# Patient Record
Sex: Female | Born: 1990 | Race: White | Hispanic: No | Marital: Single | Smoking: Never smoker
Health system: Southern US, Community
[De-identification: ages and names within clinical notes are randomized; demographics above are authoritative.]

## PROBLEM LIST (undated history)

## (undated) DIAGNOSIS — K219 Gastro-esophageal reflux disease without esophagitis: Secondary | ICD-10-CM

## (undated) DIAGNOSIS — Z8719 Personal history of other diseases of the digestive system: Secondary | ICD-10-CM

## (undated) DIAGNOSIS — K589 Irritable bowel syndrome without diarrhea: Secondary | ICD-10-CM

## (undated) DIAGNOSIS — F909 Attention-deficit hyperactivity disorder, unspecified type: Secondary | ICD-10-CM

## (undated) HISTORY — DX: Attention-deficit hyperactivity disorder, unspecified type: F90.9

## (undated) HISTORY — DX: Irritable bowel syndrome, unspecified: K58.9

---
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
## 2012-02-26 ENCOUNTER — Emergency Department (HOSPITAL_COMMUNITY)
Admission: EM | Admit: 2012-02-26 | Discharge: 2012-02-26 | Disposition: A | Discharge status: Home / Self Care | Payer: Medicaid Other | Attending: Emergency Medicine | Admitting: Emergency Medicine

## 2012-02-26 ENCOUNTER — Encounter (HOSPITAL_COMMUNITY): Payer: Self-pay | Admitting: Emergency Medicine

## 2012-02-26 DIAGNOSIS — R1013 Epigastric pain: Secondary | ICD-10-CM

## 2012-02-26 DIAGNOSIS — N939 Abnormal uterine and vaginal bleeding, unspecified: Secondary | ICD-10-CM

## 2012-02-26 DIAGNOSIS — N898 Other specified noninflammatory disorders of vagina: Secondary | ICD-10-CM | POA: Insufficient documentation

## 2012-02-26 DIAGNOSIS — K3189 Other diseases of stomach and duodenum: Secondary | ICD-10-CM | POA: Insufficient documentation

## 2012-02-26 DIAGNOSIS — Z3202 Encounter for pregnancy test, result negative: Secondary | ICD-10-CM | POA: Insufficient documentation

## 2012-02-26 LAB — COMPREHENSIVE METABOLIC PANEL
Albumin: 3.9 g/dL (ref 3.5–5.2)
Alkaline Phosphatase: 41 U/L (ref 39–117)
BUN: 9 mg/dL (ref 6–23)
Chloride: 105 mEq/L (ref 96–112)
GFR calc Af Amer: >90 mL/min (ref >90)
Glucose, Bld: 94 mg/dL (ref 70–99)
Potassium: 3.8 mEq/L (ref 3.5–5.1)
Total Bilirubin: 0.2 mg/dL — ABNORMAL LOW (ref 0.3–1.2)

## 2012-02-26 LAB — CBC WITH DIFFERENTIAL/PLATELET
Hemoglobin: 12 g/dL (ref 12.0–15.0)
Lymphocytes Relative: 30 % (ref 12–46)
Lymphs Abs: 1.6 10*3/uL (ref 0.7–4.0)
Monocytes Relative: 8 % (ref 3–12)
Neutro Abs: 3 10*3/uL (ref 1.7–7.7)
Neutrophils Relative %: 57 % (ref 43–77)
RBC: 4.12 MIL/uL (ref 3.87–5.11)

## 2012-02-26 LAB — URINALYSIS, ROUTINE W REFLEX MICROSCOPIC
Nitrite: NEGATIVE
Specific Gravity, Urine: 1.017 (ref 1.005–1.030)
pH: 5.5 (ref 5.0–8.0)

## 2012-02-26 LAB — URINE MICROSCOPIC-ADD ON

## 2012-02-26 LAB — PREGNANCY, URINE: Preg Test, Ur: NEGATIVE

## 2012-02-26 MED ORDER — OMEPRAZOLE 20 MG PO CPDR: 40.0000 mg | DELAYED_RELEASE_CAPSULE | Freq: Every day | ORAL | Status: DC

## 2012-02-26 MED ORDER — SULFAMETHOXAZOLE-TRIMETHOPRIM 800-160 MG PO TABS: 1.0000 | ORAL_TABLET | Freq: Two times a day (BID) | ORAL | Status: DC

## 2012-02-26 NOTE — ED Notes (Signed)
Pt c/o abd pain x 2 months, dizzy,nausea. Pt sts she had a large blood clot today.VSS.

## 2012-02-26 NOTE — ED Notes (Signed)
Pt from home, alert and oriented, with c/o abdominal pain x 1 month. sts she also passed a large blood clot earlier today- no bleeding before or after clot passed. Sts she has been on birth control x 2 years and has not had any bleeding since beginning. Sts she has also had nausea before meals and stomach upset after meals x 1 month. No nausea or abdominal pain at this time.

## 2012-02-26 NOTE — ED Provider Notes (Signed)
History    CSN: [id number] Arrival date & time 02/26/12  Mikle Bosworth First MD Initiated Contact with Patient 02/26/12 2039     Chief Complaint  Patient presents with  . Vaginal Bleeding   HPI Pt has been having trouble with her stomach for about 2 months.  She notices that if she eats something she will get cramping.  She also has trouble if she doesn't eat for a while.  She feels like it is in knots.  This has been ongoing.  Today she had some blood from her vagina (small blood clot) and when she mentioned this to her sister she thought she should come in to make sure she was not pregnant.  She denies any lower abdominal pain.  No persistent bleeding  No fever.  Last vomited one week ago.  No diarrhea.  History reviewed. No pertinent past medical history.  History reviewed. No pertinent past surgical history.  No family history on file.  History  Substance Use Topics  . Smoking status: Never Smoker   . Smokeless tobacco: Not on file  . Alcohol Use: No    OB History    Grav Para Term Preterm Abortions TAB SAB Ect Mult Living                  Review of Systems  All other systems reviewed and are negative.    Allergies  Review of patient's allergies indicates no known allergies.  Home Medications   Current Outpatient Rx  Name  Route  Sig  Dispense  Refill  . ACETAMINOPHEN 500 MG PO TABS   Oral   Take 1,000 mg by mouth every 6 (six) hours as needed. pain         . ETONOGESTREL 68 MG [place] IMPL   Subcutaneous   Inject 1 each into the skin once.             BP 126/71  Pulse 87  Temp 97.9 F (36.6 C) (Oral)  Resp 12  SpO2 97%  LMP 02/26/2012  Physical Exam  Nursing note and vitals reviewed. Constitutional: She appears well-developed and well-nourished. No distress.  HENT:  Head: Normocephalic and atraumatic.  Right Ear: External ear normal.  Left Ear: External ear normal.  Eyes: Conjunctivae normal are normal. Right eye exhibits no discharge. Left eye  exhibits no discharge. No scleral icterus.  Neck: Neck supple. No tracheal deviation present.  Cardiovascular: Normal rate, regular rhythm and intact distal pulses.   Pulmonary/Chest: Effort normal and breath sounds normal. No stridor. No respiratory distress. She has no wheezes. She has no rales.  Abdominal: Soft. Bowel sounds are normal. She exhibits no distension. There is no tenderness. There is no rebound and no guarding.  Musculoskeletal: She exhibits no edema and no tenderness.  Neurological: She is alert. She has normal strength. No sensory deficit. Cranial nerve deficit:  no gross defecits noted. She exhibits normal muscle tone. She displays no seizure activity. Coordination normal.  Skin: Skin is warm and dry. No rash noted.  Psychiatric: She has a normal mood and affect.    ED Course  Procedures (including critical care time)  Labs Reviewed  URINALYSIS, ROUTINE W REFLEX MICROSCOPIC - Abnormal; Notable for the following:    APPearance CLOUDY (*)     Hgb urine dipstick MODERATE (*)     Leukocytes, UA MODERATE (*)     All other components within normal limits  CBC WITH DIFFERENTIAL - Abnormal; Notable for the following:  HCT 34.5 (*)     All other components within normal limits  COMPREHENSIVE METABOLIC PANEL - Abnormal; Notable for the following:    Total Bilirubin 0.2 (*)     All other components within normal limits  URINE MICROSCOPIC-ADD ON - Abnormal; Notable for the following:    Squamous Epithelial / LPF FEW (*)     Bacteria, UA FEW (*)     All other components within normal limits  PREGNANCY, URINE  URINE CULTURE   No results found.   1. Vaginal bleeding   2. Dyspepsia       MDM  Pt is not pregnant. SHe has no lower abdominal complaints.  Pt defers pelvic exam.  Will try course of antacids for her symptoms that have been ongoing for the last two months.  Recc outpt follow up with PCP.  At this time there does not appear to be any evidence of an acute  emergency medical condition and the patient appears stable for discharge with appropriate outpatient follow up.        Celene Kras, MD 02/26/12 2110

## 2012-02-27 LAB — URINE CULTURE: Culture: NO GROWTH

## 2012-03-14 ENCOUNTER — Other Ambulatory Visit: Payer: Self-pay | Admitting: Family Medicine

## 2012-03-14 DIAGNOSIS — R1011 Right upper quadrant pain: Secondary | ICD-10-CM

## 2012-03-20 ENCOUNTER — Ambulatory Visit (HOSPITAL_COMMUNITY): Payer: Self-pay

## 2012-08-19 ENCOUNTER — Encounter: Payer: Self-pay | Admitting: *Deleted

## 2012-08-20 ENCOUNTER — Ambulatory Visit: Payer: Self-pay | Admitting: Adult Health

## 2012-10-03 ENCOUNTER — Emergency Department (HOSPITAL_COMMUNITY): Payer: BC Managed Care – PPO

## 2012-10-03 ENCOUNTER — Emergency Department (HOSPITAL_COMMUNITY)
Admission: EM | Admit: 2012-10-03 | Discharge: 2012-10-03 | Disposition: A | Discharge status: Home / Self Care | Payer: BC Managed Care – PPO | Attending: Emergency Medicine | Admitting: Emergency Medicine

## 2012-10-03 ENCOUNTER — Encounter (HOSPITAL_COMMUNITY): Payer: Self-pay | Admitting: *Deleted

## 2012-10-03 DIAGNOSIS — R109 Unspecified abdominal pain: Secondary | ICD-10-CM

## 2012-10-03 DIAGNOSIS — Z3202 Encounter for pregnancy test, result negative: Secondary | ICD-10-CM | POA: Insufficient documentation

## 2012-10-03 DIAGNOSIS — R1013 Epigastric pain: Secondary | ICD-10-CM | POA: Insufficient documentation

## 2012-10-03 LAB — CBC WITH DIFFERENTIAL/PLATELET
Basophils Absolute: 0 10*3/uL (ref 0.0–0.1)
Basophils Relative: 1 % (ref 0–1)
Hemoglobin: 12.9 g/dL (ref 12.0–15.0)
MCHC: 34.7 g/dL (ref 30.0–36.0)
Monocytes Relative: 6 % (ref 3–12)
Neutro Abs: 3.1 10*3/uL (ref 1.7–7.7)
Neutrophils Relative %: 64 % (ref 43–77)

## 2012-10-03 LAB — COMPREHENSIVE METABOLIC PANEL
ALT: 12 U/L (ref 0–35)
AST: 18 U/L (ref 0–37)
Albumin: 4.2 g/dL (ref 3.5–5.2)
Alkaline Phosphatase: 38 U/L — ABNORMAL LOW (ref 39–117)
BUN: 12 mg/dL (ref 6–23)
Chloride: 105 mEq/L (ref 96–112)
Potassium: 3.9 mEq/L (ref 3.5–5.1)
Sodium: 140 mEq/L (ref 135–145)
Total Bilirubin: 0.2 mg/dL — ABNORMAL LOW (ref 0.3–1.2)

## 2012-10-03 LAB — URINE MICROSCOPIC-ADD ON

## 2012-10-03 LAB — URINALYSIS, ROUTINE W REFLEX MICROSCOPIC
Bilirubin Urine: NEGATIVE
Glucose, UA: NEGATIVE mg/dL
Ketones, ur: NEGATIVE mg/dL
pH: 7.5 (ref 5.0–8.0)

## 2012-10-03 MED ORDER — SUCRALFATE 1 GM/10ML PO SUSP: 1.0000 g | Freq: Four times a day (QID) | ORAL | Status: DC

## 2012-10-03 MED ORDER — PANTOPRAZOLE SODIUM 20 MG PO TBEC: 20.0000 mg | DELAYED_RELEASE_TABLET | Freq: Every day | ORAL | Status: DC

## 2012-10-03 NOTE — ED Provider Notes (Signed)
History    CSN: [id number] Arrival date & time 10/03/12  1630  First MD Initiated Contact with Patient 10/03/12 1905     Chief Complaint  Patient presents with  . Abdominal Pain   (Consider location/radiation/quality/duration/timing/severity/associated sxs/prior Treatment) HPI Comments: Patient presents to ER for evaluation of abdominal pain. Patient has been having pain in the center of her upper abdomen radiating up into the chest for several months. She saw her doctor and was told it might be an ulcer, was scheduled for an ultrasound. Patient did not make the ultrasound and is now experiencing increased discomfort. Patient reports a burning, crampy pain in the upper abdomen that occurs intermittently. She has not had vomiting. There is no rectal bleeding or dark stools.  Patient is a 22 y.o. female presenting with abdominal pain.  Abdominal Pain Associated symptoms include abdominal pain.   Past Medical History  Diagnosis Date  . Medical history non-contributory    Past Surgical History  Procedure Laterality Date  . Right arm  2007    aztec nm   Family History  Problem Relation Age of Onset  . Hypertension Other   . Cancer Other    History  Substance Use Topics  . Smoking status: Never Smoker   . Smokeless tobacco: Not on file  . Alcohol Use: No   OB History   Grav Para Term Preterm Abortions TAB SAB Ect Mult Living                 Review of Systems  Constitutional: Negative for fever.  Gastrointestinal: Positive for abdominal pain. Negative for blood in stool.  All other systems reviewed and are negative.    Allergies  Review of patient's allergies indicates no known allergies.  Home Medications   Current Outpatient Rx  Name  Route  Sig  Dispense  Refill  . etonogestrel (IMPLANON) 68 MG IMPL implant   Subcutaneous   Inject 1 each into the skin once.            BP 112/71  Pulse 69  Temp(Src) 98.8 F (37.1 C) (Oral)  Resp 16  SpO2  100% Physical Exam  Constitutional: She is oriented to person, place, and time. She appears well-developed and well-nourished. No distress.  HENT:  Head: Normocephalic and atraumatic.  Right Ear: Hearing normal.  Left Ear: Hearing normal.  Nose: Nose normal.  Mouth/Throat: Oropharynx is clear and moist and mucous membranes are normal.  Eyes: Conjunctivae and EOM are normal. Pupils are equal, round, and reactive to light.  Neck: Normal range of motion. Neck supple.  Cardiovascular: Regular rhythm, S1 normal and S2 normal.  Exam reveals no gallop and no friction rub.   No murmur heard. Pulmonary/Chest: Effort normal and breath sounds normal. No respiratory distress. She exhibits no tenderness.  Abdominal: Soft. Normal appearance and bowel sounds are normal. There is no hepatosplenomegaly. There is tenderness in the epigastric area. There is no rebound, no guarding, no tenderness at McBurney's point and negative Murphy's sign. No hernia.  Musculoskeletal: Normal range of motion.  Neurological: She is alert and oriented to person, place, and time. She has normal strength. No cranial nerve deficit or sensory deficit. Coordination normal. GCS eye subscore is 4. GCS verbal subscore is 5. GCS motor subscore is 6.  Skin: Skin is warm, dry and intact. No rash noted. No cyanosis.  Psychiatric: She has a normal mood and affect. Her speech is normal and behavior is normal. Thought content normal.  ED Course  Procedures (including critical care time) Labs Reviewed  COMPREHENSIVE METABOLIC PANEL - Abnormal; Notable for the following:    Alkaline Phosphatase 38 (*)    Total Bilirubin 0.2 (*)    All other components within normal limits  URINALYSIS, ROUTINE W REFLEX MICROSCOPIC - Abnormal; Notable for the following:    APPearance CLOUDY (*)    Hgb urine dipstick SMALL (*)    Nitrite POSITIVE (*)    Leukocytes, UA SMALL (*)    All other components within normal limits  URINE MICROSCOPIC-ADD ON -  Abnormal; Notable for the following:    Squamous Epithelial / LPF FEW (*)    Bacteria, UA MANY (*)    All other components within normal limits  URINE CULTURE  CBC WITH DIFFERENTIAL  LIPASE, BLOOD  POCT PREGNANCY, URINE   US Abdomen Complete  10/03/2012   *RADIOLOGY REPORT*  Clinical Data:  Abdominal pain  COMPLETE ABDOMINAL ULTRASOUND  Comparison:  None.  Findings:  Gallbladder:  Contracted gallbladder with limited evaluation of the gallbladder.  Gallbladder wall is thickened due to incomplete distention.  The patient claims n.p.o. for 7 hours.  The patient is not tender over the gallbladder.  No gallstones are identified.  Common bile duct:  2.5 mm  Liver:  No focal lesion identified.  Within normal limits in parenchymal echogenicity.  IVC:  Appears normal.  Pancreas:  No focal abnormality seen.  Spleen:  9.4 cm  Right Kidney:  12.0 cm.  Normal  Left Kidney:  10.5 cm.  Normal  Abdominal aorta:  No aneurysm identified.  IMPRESSION: Contracted gallbladder limiting evaluation of the gallbladder. Gallbladder wall is thickened, consistent with incomplete distention.  Follow-up ultrasound may be helpful to evaluate the gallbladder.   Original Report Authenticated By: Janeece Riggers, M.D.   Diagnosis: Abdominal pain  MDM  Patient comes to the ER for evaluation of abdominal pain. Patient has been having epigastric abdominal discomfort for several months. She reports that she was supposed to have an ultrasound performed by her doctor, but missed the evaluation. She does have worsening symptoms and wants to be evaluated. She did not have right upper quadrant tenderness, but was insistent that she required an ultrasound. Ultrasound shows no evidence of gallbladder disease. Blood work was unremarkable. She does have a urinary tract infection. Patient will be treated for urinary tract infection and is referred to GI as she likely has GERD, but cannot rule out peptic ulcer. She is not anemic and has not had any GI  bleeding.  Gilda Crease, MD 10/03/12 2040

## 2012-10-03 NOTE — ED Notes (Signed)
Pt reports having lower and upper abd pains for extended amount of time, pt has seen pcp and was told it was possible ulcer but pt did not go for further testing. Still having the abd pain and nausea. No acute distress noted at triage.

## 2012-10-05 LAB — URINE CULTURE

## 2012-10-06 ENCOUNTER — Telehealth (HOSPITAL_COMMUNITY): Payer: Self-pay | Admitting: Emergency Medicine

## 2012-10-06 NOTE — ED Notes (Signed)
Post ED Visit - Positive Culture Follow-up: Successful Patient Follow-Up  Culture assessed and recommendations reviewed by: []  Wes Dulaney, Pharm.D., BCPS []  Celedonio Miyamoto, [address].D., BCPS []  Georgina Pillion, [address].D., BCPS []  [facility], [address].D., BCPS, AAHIVP []  Estella Husk, Pharm.D., BCPS, AAHIVP [x]  Okey Regal, Pharm.D., BCPS  Positive urine culture  [x]  Patient discharged without antimicrobial prescription and treatment is now indicated []  Organism is resistant to prescribed ED discharge antimicrobial []  Patient with positive blood cultures  Changes discussed with ED provider: Marlon Pel PA-c New antibiotic prescription: Kelfex 500 mg BID x 7 days    Kylie A Holland 10/06/2012, 3:21 PM

## 2012-10-06 NOTE — Progress Notes (Signed)
ED Antimicrobial Stewardship Positive Culture Follow Up   Deloria Brassfield is an 22 y.o. female who presented to [facility] on 10/03/2012 with a chief complaint of  Chief Complaint  Patient presents with  . Abdominal Pain    Recent Results (from the past 720 hour(s))  URINE CULTURE     Status: None   Collection Time    10/03/12  4:55 PM      Result Value Range Status   Specimen Description URINE, RANDOM   Final   Special Requests NONE   Final   Culture  Setup Time 10/03/2012 22:53   Final   Colony Count >=100,000 COLONIES/ML   Final   Culture ESCHERICHIA COLI   Final   Report Status 10/05/2012 FINAL   Final   Organism ID, Bacteria ESCHERICHIA COLI   Final    []  Treated with , organism resistant to prescribed antimicrobial [x]  Patient discharged originally without antimicrobial agent and treatment is now indicated  New antibiotic prescription: Keflex 500 mg po BID x 7 days ED Provider: Luberta Robertson 10/06/2012, 2:40 PM Infectious Diseases Pharmacist Phone# [phone]

## 2015-04-20 ENCOUNTER — Emergency Department (HOSPITAL_COMMUNITY): Payer: Medicaid Other

## 2015-04-20 ENCOUNTER — Encounter (HOSPITAL_COMMUNITY): Payer: Self-pay | Admitting: Emergency Medicine

## 2015-04-20 ENCOUNTER — Emergency Department (HOSPITAL_COMMUNITY)
Admission: EM | Admit: 2015-04-20 | Discharge: 2015-04-20 | Disposition: A | Discharge status: Home / Self Care | Payer: Medicaid Other | Attending: Emergency Medicine | Admitting: Emergency Medicine

## 2015-04-20 DIAGNOSIS — M79672 Pain in left foot: Secondary | ICD-10-CM | POA: Insufficient documentation

## 2015-04-20 DIAGNOSIS — M7989 Other specified soft tissue disorders: Secondary | ICD-10-CM | POA: Insufficient documentation

## 2015-04-20 MED ORDER — IBUPROFEN 600 MG PO TABS: 600.0000 mg | ORAL_TABLET | Freq: Three times a day (TID) | ORAL | Status: DC

## 2015-04-20 MED ORDER — TRAMADOL HCL 50 MG PO TABS: 50.0000 mg | ORAL_TABLET | Freq: Four times a day (QID) | ORAL | Status: DC | PRN

## 2015-04-20 MED ORDER — TRAMADOL HCL 50 MG PO TABS
50.0000 mg | ORAL_TABLET | Freq: Once | ORAL | Status: AC
  Administered 2015-04-20: 50 mg via ORAL
  Filled 2015-04-20: qty 1

## 2015-04-20 NOTE — ED Notes (Signed)
Pt states that she has been having pain in left great toe and ball of foot for several months.  Unable to bear weight without pain. Denies injury.

## 2015-04-20 NOTE — ED Provider Notes (Signed)
CSN: [id number]     Arrival date & time 04/20/15  1807 History   First MD Initiated Contact with Patient 04/20/15 1825     Chief Complaint  Patient presents with  . Foot Pain     (Consider location/radiation/quality/duration/timing/severity/associated sxs/prior Treatment) The history is provided by the patient.   Pamela Potts is a 25 y.o. female  Presenting with pain and swelling of her left plantar foot at the 1st metarsal head.  She denies injury, reports similar episode of pain months ago despite again no known injury which lasted about a week then resolved.  Her pain is present with weight bearing and palpation, better at rest.  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  Her pain is sharp and does not radiate.  She has found no alleviators.  Denies fevers, rash, skin trauma.     Past Medical History  Diagnosis Date  . Medical history non-contributory    Past Surgical History  Procedure Laterality Date  . Right arm  2007    aztec nm   Family History  Problem Relation Age of Onset  . Hypertension Other   . Cancer Other    Social History  Substance Use Topics  . Smoking status: Never Smoker   . Smokeless tobacco: None  . Alcohol Use: No   OB History    No data available     Review of Systems  Constitutional: Negative for fever.  Musculoskeletal: Positive for joint swelling and arthralgias. Negative for myalgias.  Neurological: Negative for weakness and numbness.      Allergies  Review of patient's allergies indicates no known allergies.  Home Medications   Prior to Admission medications   Medication Sig Start Date End Date Taking? Authorizing Provider  etonogestrel (IMPLANON) 68 MG IMPL implant Inject 1 each into the skin once.      Historical Provider, MD  traMADol (ULTRAM) 50 MG tablet Take 1 tablet (50 mg total) by mouth every 6 (six) hours as needed. 04/20/15   Burgess Amor, PA-C   BP 114/96 mmHg  Pulse 92  Temp(Src) 98.6 F (37  C) (Oral)  Resp 16  Ht 5\' 5"  (1.651 m)  Wt 58.514 kg  BMI 21.47 kg/m2  SpO2 100%  LMP 04/13/2015 Physical Exam  Constitutional: She appears well-developed and well-nourished.  HENT:  Head: Atraumatic.  Neck: Normal range of motion.  Cardiovascular:  Pulses:      Dorsalis pedis pulses are 2+ on the right side, and 2+ on the left side.  Pulses equal bilaterally  Musculoskeletal: She exhibits tenderness.       Left foot: There is tenderness and swelling. There is normal capillary refill, no crepitus and no deformity.  ttp left metarsal head of left foot. Edema appreciated at this site in comparison to the right, no erythema.  Skin intact. She has pronounced high instep bilaterally.  Neurological: She is alert. She has normal strength. She displays normal reflexes. No sensory deficit.  Skin: Skin is warm and dry. No ecchymosis and no rash noted. No erythema.  Psychiatric: She has a normal mood and affect.    ED Course  Procedures (including critical care time) Labs Review Labs Reviewed - No data to display  Imaging Review Dg Foot Complete Left  04/20/2015  CLINICAL DATA:  Pain in the first MTP joint of the left foot since this morning without injury. EXAM: LEFT FOOT - COMPLETE 3+ VIEW COMPARISON:  None. FINDINGS: There is no evidence of  fracture or dislocation. There is no evidence of arthropathy. No acute soft tissue finding. Incidental bipartite appearance of the great toe medial sesamoid. The lucency within the cortex of the distal fibula has a benign appearance favoring nutrient channel. IMPRESSION: Negative. Electronically Signed   By: Marnee SpringJonathon  Watts M.D.   On: 04/20/2015 19:06   I have personally reviewed and evaluated these images and lab results as part of my medical decision-making.   EKG Interpretation None      MDM   Final diagnoses:  Foot pain, left    Pt is point tender at the site of the sesamoids, question significance of the bipartite appearance of this  bone.  No apparent fx/dislocation.  With her high instep, normal ambulation will put extra strain and pressure at this site.  She was encouraged ice, elevation, crutches provided for prn use.  She was prescribed ibuprofen and tramadol for pain and inflammation.  Referred to ortho for further eval/management.  The patient appears reasonably screened and/or stabilized for discharge and I doubt any other medical condition or other [facility] requiring further screening, evaluation, or treatment in the ED at this time prior to discharge.     Burgess AmorJulie Annmargaret Decaprio, PA-C 04/21/15 1355  Samuel JesterKathleen McManus, DO 04/24/15 [phone]

## 2015-04-20 NOTE — Discharge Instructions (Signed)

## 2015-04-20 NOTE — ED Notes (Signed)
Pt alert & oriented x4, stable gait. Patient given discharge instructions, paperwork & prescription(s). Patient  instructed to stop at the registration desk to finish any additional paperwork. Patient verbalized understanding. Pt left department w/ no further questions. 

## 2016-04-20 ENCOUNTER — Encounter: Payer: Self-pay | Admitting: Family Medicine

## 2016-04-20 ENCOUNTER — Ambulatory Visit (INDEPENDENT_AMBULATORY_CARE_PROVIDER_SITE_OTHER): Payer: 59 | Admitting: Family Medicine

## 2016-04-20 VITALS — BP 112/74 | HR 85 | Temp 97.7°F | Ht 65.0 in | Wt 131.5 lb

## 2016-04-20 DIAGNOSIS — K219 Gastro-esophageal reflux disease without esophagitis: Secondary | ICD-10-CM

## 2016-04-20 DIAGNOSIS — N926 Irregular menstruation, unspecified: Secondary | ICD-10-CM | POA: Diagnosis not present

## 2016-04-20 DIAGNOSIS — F902 Attention-deficit hyperactivity disorder, combined type: Secondary | ICD-10-CM | POA: Diagnosis not present

## 2016-04-20 DIAGNOSIS — F411 Generalized anxiety disorder: Secondary | ICD-10-CM

## 2016-04-20 LAB — PREGNANCY, URINE: PREG TEST UR: NEGATIVE

## 2016-04-20 MED ORDER — ESCITALOPRAM OXALATE 10 MG PO TABS: 10.0000 mg | ORAL_TABLET | Freq: Every day | ORAL | 1 refills | Status: DC

## 2016-04-20 MED ORDER — LISDEXAMFETAMINE DIMESYLATE 30 MG PO CAPS: 30.0000 mg | ORAL_CAPSULE | Freq: Every day | ORAL | 0 refills | Status: DC

## 2016-04-20 MED ORDER — RANITIDINE HCL 150 MG PO TABS: 150.0000 mg | ORAL_TABLET | Freq: Two times a day (BID) | ORAL | 2 refills | Status: DC
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
## 2016-04-26 LAB — TOXASSURE SELECT 13 (MW), URINE

## 2016-05-25 ENCOUNTER — Ambulatory Visit: Payer: 59 | Admitting: Family Medicine

## 2016-05-26 ENCOUNTER — Encounter: Payer: Self-pay | Admitting: Family Medicine

## 2016-05-26 ENCOUNTER — Ambulatory Visit (INDEPENDENT_AMBULATORY_CARE_PROVIDER_SITE_OTHER): Payer: 59 | Admitting: Family Medicine

## 2016-05-26 VITALS — BP 102/64 | HR 74 | Temp 97.6°F | Ht 65.0 in | Wt 131.0 lb

## 2016-05-26 DIAGNOSIS — F902 Attention-deficit hyperactivity disorder, combined type: Secondary | ICD-10-CM | POA: Diagnosis not present

## 2016-05-26 DIAGNOSIS — K219 Gastro-esophageal reflux disease without esophagitis: Secondary | ICD-10-CM

## 2016-05-26 DIAGNOSIS — F411 Generalized anxiety disorder: Secondary | ICD-10-CM

## 2016-05-26 MED ORDER — OMEPRAZOLE 20 MG PO CPDR: 20.0000 mg | DELAYED_RELEASE_CAPSULE | Freq: Every day | ORAL | 3 refills | Status: DC

## 2016-05-26 MED ORDER — ESCITALOPRAM OXALATE 20 MG PO TABS: 20.0000 mg | ORAL_TABLET | Freq: Every day | ORAL | 1 refills | Status: DC

## 2016-05-26 MED ORDER — LISDEXAMFETAMINE DIMESYLATE 40 MG PO CAPS: 40.0000 mg | ORAL_CAPSULE | Freq: Every day | ORAL | 0 refills | Status: DC
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
## 2016-06-04 ENCOUNTER — Encounter: Payer: Self-pay | Admitting: Family Medicine

## 2016-06-05 ENCOUNTER — Other Ambulatory Visit: Payer: Self-pay | Admitting: Family Medicine

## 2016-06-05 DIAGNOSIS — F902 Attention-deficit hyperactivity disorder, combined type: Secondary | ICD-10-CM

## 2016-06-05 DIAGNOSIS — K219 Gastro-esophageal reflux disease without esophagitis: Secondary | ICD-10-CM

## 2016-06-05 DIAGNOSIS — F411 Generalized anxiety disorder: Secondary | ICD-10-CM

## 2016-06-05 MED ORDER — LISDEXAMFETAMINE DIMESYLATE 40 MG PO CAPS: 40.0000 mg | ORAL_CAPSULE | Freq: Every day | ORAL | 0 refills | Status: DC

## 2016-06-05 MED ORDER — OMEPRAZOLE 20 MG PO CPDR: 20.0000 mg | DELAYED_RELEASE_CAPSULE | Freq: Every day | ORAL | 3 refills | Status: DC

## 2016-06-05 MED ORDER — ESCITALOPRAM OXALATE 20 MG PO TABS: 20.0000 mg | ORAL_TABLET | Freq: Every day | ORAL | 1 refills | Status: DC

## 2016-06-05 NOTE — Progress Notes (Unsigned)
Prescription at front desk, patient aware

## 2016-06-23 ENCOUNTER — Encounter: Payer: Self-pay | Admitting: Family Medicine

## 2016-06-23 ENCOUNTER — Ambulatory Visit (INDEPENDENT_AMBULATORY_CARE_PROVIDER_SITE_OTHER): Payer: Self-pay | Admitting: Family Medicine

## 2016-06-23 VITALS — BP 105/64 | HR 68 | Temp 97.0°F | Ht 65.0 in | Wt 134.8 lb

## 2016-06-23 DIAGNOSIS — F411 Generalized anxiety disorder: Secondary | ICD-10-CM

## 2016-06-23 DIAGNOSIS — F902 Attention-deficit hyperactivity disorder, combined type: Secondary | ICD-10-CM

## 2016-06-23 MED ORDER — BUPROPION HCL ER (XL) 150 MG PO TB24: 150.0000 mg | ORAL_TABLET | Freq: Every day | ORAL | 2 refills | Status: DC

## 2016-06-23 MED ORDER — ESCITALOPRAM OXALATE 20 MG PO TABS: 20.0000 mg | ORAL_TABLET | Freq: Every day | ORAL | 2 refills | Status: DC

## 2016-06-23 NOTE — Progress Notes (Signed)
BP 105/64   Pulse 68   Temp 97 F (36.1 C) (Oral)   Ht 5\' 5"  (1.651 m)   Wt 134 lb 12.8 oz (61.1 kg)   BMI 22.43 kg/m    Subjective:    Patient ID: Pamela Potts, female    DOB: July 08, 1990, 26 y.o.   MRN: [id number]  HPI: Pamela Potts is a 26 y.o. female presenting on 06/23/2016 for Follow-up (ADHD, anxiety depression)   HPI Anxiety and ADHD recheck Patient is coming in today for an anxiety and ADHD recheck. It's been 1 months she was seen last and she was started on Lexapro 20 and Vyvanse. She says she lost her insurance and cannot longer afford the Vyvanse that the Lexapro is okay for her to afford. Without insurance and be difficult to get her any of the ADHD medications and she understands that and wants to go for just trying to help with the depression and anxiety. She says she is feeling a lot better than when she was previously and is not having as many sadness and feeling down but she does still feel some social anxiety and is not getting out as much as she would like and has some fears towards interviews for jobs that she needs. She denies any suicidal ideations or thoughts. Herself. She does feels like she needs a little bit more and with the Lexapro has given her. She also feels down energy still. She says her attention is not too bad right now.  Relevant past medical, surgical, family and social history reviewed and updated as indicated. Interim medical history since our last visit reviewed. Allergies and medications reviewed and updated.  Review of Systems  Constitutional: Negative for chills and fever.  Respiratory: Negative for chest tightness and shortness of breath.   Cardiovascular: Negative for chest pain and leg swelling.  Genitourinary: Negative for difficulty urinating and dysuria.  Musculoskeletal: Negative for back pain and gait problem.  Skin: Negative for rash.  Neurological: Negative for light-headedness and headaches.  Psychiatric/Behavioral: Positive  for decreased concentration and dysphoric mood. Negative for agitation, behavioral problems, self-injury, sleep disturbance and suicidal ideas. The patient is nervous/anxious.   All other systems reviewed and are negative.   Per HPI unless specifically indicated above     Objective:    BP 105/64   Pulse 68   Temp 97 F (36.1 C) (Oral)   Ht 5\' 5"  (1.651 m)   Wt 134 lb 12.8 oz (61.1 kg)   BMI 22.43 kg/m   Wt Readings from Last 3 Encounters:  06/23/16 134 lb 12.8 oz (61.1 kg)  05/26/16 131 lb (59.4 kg)  04/20/16 131 lb 8 oz (59.6 kg)    Physical Exam  Constitutional: She is oriented to person, place, and time. She appears well-developed and well-nourished. No distress.  Eyes: Conjunctivae are normal.  Neck: Neck supple. No thyromegaly present.  Cardiovascular: Normal rate, regular rhythm, normal heart sounds and intact distal pulses.   No murmur heard. Pulmonary/Chest: Effort normal and breath sounds normal. No respiratory distress. She has no wheezes. She has no rales.  Musculoskeletal: Normal range of motion. She exhibits no edema or tenderness.  Lymphadenopathy:    She has no cervical adenopathy.  Neurological: She is alert and oriented to person, place, and time. Coordination normal.  Skin: Skin is warm and dry. No rash noted. She is not diaphoretic.  Psychiatric: She has a normal mood and affect. Her behavior is normal.  Nursing note and vitals reviewed.  Assessment & Plan:   Problem List Items Addressed This Visit      Other   Attention deficit hyperactivity disorder (ADHD), combined type - Primary   Generalized anxiety disorder   Relevant Medications   buPROPion (WELLBUTRIN XL) 150 MG 24 hr tablet   escitalopram (LEXAPRO) 20 MG tablet       Follow up plan: Return in about 3 months (around 09/23/2016), or if symptoms worsen or fail to improve, for Recheck anxiety and ADHD.  Counseling provided for all of the vaccine components No orders of the defined types  were placed in this encounter.   Arville CareJoshua Dettinger, MD [facility] 06/23/2016, 9:06 AM

## 2016-07-29 ENCOUNTER — Encounter: Payer: Self-pay | Admitting: Family Medicine

## 2016-07-29 DIAGNOSIS — K219 Gastro-esophageal reflux disease without esophagitis: Secondary | ICD-10-CM

## 2016-07-31 MED ORDER — OMEPRAZOLE 20 MG PO CPDR: 20.0000 mg | DELAYED_RELEASE_CAPSULE | Freq: Every day | ORAL | 3 refills | Status: DC

## 2016-08-02 ENCOUNTER — Encounter (INDEPENDENT_AMBULATORY_CARE_PROVIDER_SITE_OTHER): Payer: Self-pay | Admitting: *Deleted

## 2016-08-07 ENCOUNTER — Emergency Department (HOSPITAL_COMMUNITY)
Admission: EM | Admit: 2016-08-07 | Discharge: 2016-08-07 | Disposition: A | Discharge status: Home / Self Care | Payer: Medicaid Other | Attending: Emergency Medicine | Admitting: Emergency Medicine

## 2016-08-07 ENCOUNTER — Encounter (HOSPITAL_COMMUNITY): Payer: Self-pay

## 2016-08-07 DIAGNOSIS — R1013 Epigastric pain: Secondary | ICD-10-CM | POA: Insufficient documentation

## 2016-08-07 DIAGNOSIS — R110 Nausea: Secondary | ICD-10-CM | POA: Insufficient documentation

## 2016-08-07 DIAGNOSIS — F909 Attention-deficit hyperactivity disorder, unspecified type: Secondary | ICD-10-CM | POA: Insufficient documentation

## 2016-08-07 HISTORY — DX: Personal history of other diseases of the digestive system: Z87.19

## 2016-08-07 LAB — CBC WITH DIFFERENTIAL/PLATELET
Basophils Absolute: 0 10*3/uL (ref 0.0–0.1)
Basophils Relative: 1 %
Eosinophils Absolute: 0.1 10*3/uL (ref 0.0–0.7)
Eosinophils Relative: 1 %
HCT: 39.1 % (ref 36.0–46.0)
HEMOGLOBIN: 13.5 g/dL (ref 12.0–15.0)
LYMPHS ABS: 1 10*3/uL (ref 0.7–4.0)
LYMPHS PCT: 23 %
MCH: 31.3 pg (ref 26.0–34.0)
MCHC: 34.5 g/dL (ref 30.0–36.0)
MCV: 90.5 fL (ref 78.0–100.0)
MONOS PCT: 9 %
Monocytes Absolute: 0.4 10*3/uL (ref 0.1–1.0)
NEUTROS ABS: 3 10*3/uL (ref 1.7–7.7)
NEUTROS PCT: 66 %
Platelets: 230 10*3/uL (ref 150–400)
RBC: 4.32 MIL/uL (ref 3.87–5.11)
RDW: 12.1 % (ref 11.5–15.5)
WBC: 4.5 10*3/uL (ref 4.0–10.5)

## 2016-08-07 LAB — COMPREHENSIVE METABOLIC PANEL
ALBUMIN: 4.4 g/dL (ref 3.5–5.0)
ALK PHOS: 26 U/L — AB (ref 38–126)
ALT: 14 U/L (ref 14–54)
ANION GAP: 6 (ref 5–15)
AST: 18 U/L (ref 15–41)
BUN: 10 mg/dL (ref 6–20)
CO2: 27 mmol/L (ref 22–32)
Calcium: 9.2 mg/dL (ref 8.9–10.3)
Chloride: 104 mmol/L (ref 101–111)
Creatinine, Ser: 0.76 mg/dL (ref 0.44–1.00)
GFR calc Af Amer: >60 mL/min (ref >60)
GFR calc non Af Amer: >60 mL/min (ref >60)
GLUCOSE: 85 mg/dL (ref 65–99)
Potassium: 3.5 mmol/L (ref 3.5–5.1)
Sodium: 137 mmol/L (ref 135–145)
Total Bilirubin: 0.3 mg/dL (ref 0.3–1.2)
Total Protein: 7.6 g/dL (ref 6.5–8.1)

## 2016-08-07 LAB — URINALYSIS, ROUTINE W REFLEX MICROSCOPIC
BILIRUBIN URINE: NEGATIVE
GLUCOSE, UA: NEGATIVE mg/dL
Hgb urine dipstick: NEGATIVE
Ketones, ur: NEGATIVE mg/dL
Leukocytes, UA: NEGATIVE
NITRITE: NEGATIVE
PH: 6 (ref 5.0–8.0)
Protein, ur: NEGATIVE mg/dL
Specific Gravity, Urine: 1.005 (ref 1.005–1.030)

## 2016-08-07 LAB — I-STAT BETA HCG BLOOD, ED (MC, WL, AP ONLY): I-stat hCG, quantitative: <5 m[IU]/mL (ref <5)

## 2016-08-07 LAB — LIPASE, BLOOD: Lipase: 30 U/L (ref 11–51)

## 2016-08-07 MED ORDER — PANTOPRAZOLE SODIUM 40 MG IV SOLR
40.0000 mg | Freq: Once | INTRAVENOUS | Status: AC
  Administered 2016-08-07: 40 mg via INTRAVENOUS
  Filled 2016-08-07: qty 40

## 2016-08-07 MED ORDER — ONDANSETRON 4 MG PO TBDP: ORAL_TABLET | ORAL | 0 refills | Status: DC

## 2016-08-07 NOTE — ED Provider Notes (Addendum)
[facility]Y DEPT Provider Note   CSN: [id number] Arrival date & time: 08/07/16  1031  By signing my name below, I, Pamela Potts, attest that this documentation has been prepared under the direction and in the presence of Bethann Berkshire, MD . Electronically Signed: Majel Potts, Scribe. 08/07/2016. 12:30 PM.  History   Chief Complaint Chief Complaint  Patient presents with  . Abdominal Pain   The history is provided by the patient. No language interpreter was used.  Abdominal Pain   This is a chronic problem. The current episode started more than 1 week ago. The problem occurs constantly. The pain is associated with eating. The pain is located in the epigastric region. The quality of the pain is dull. Associated symptoms include nausea. Pertinent negatives include diarrhea, vomiting, frequency, hematuria and headaches. The symptoms are aggravated by eating. Nothing relieves the symptoms. Her past medical history is significant for GERD.   HPI Comments: Pamela Potts is a 26 y.o. female with PMHx of gastric ulcers, who presents to the Emergency Department for an evaluation of constant, "dull," epigastric abdominal pain. Pt reports associated nausea that is exacerbated while eating food. She states she was diagnosed with gastric ulcers "several years ago" in which she was prescribed a medication that provided no relief. She notes she visited her PCP, Dr. Louanne Skye, ~2 months ago in which she was placed on another medication for GERD with no relief. Pt reports she was recently referred to a GI specialist and is scheduled for an endoscopy on 02/22/17. She any vomiting, diarrhea, or PSHx to her abdomen.   Past Medical History:  Diagnosis Date  . ADHD   . History of stomach ulcers   . Medical history non-contributory    Patient Active Problem List   Diagnosis Date Noted  . Attention deficit hyperactivity disorder (ADHD), combined type 05/26/2016  . Generalized anxiety disorder 05/26/2016     Past Surgical History:  Procedure Laterality Date  . right arm  2007   aztec nm    OB History    No data available     Home Medications    Prior to Admission medications   Medication Sig Start Date End Date Taking? Authorizing Provider  buPROPion (WELLBUTRIN XL) 150 MG 24 hr tablet Take 1 tablet (150 mg total) by mouth daily. 06/23/16   Elige Radon Dettinger, MD  escitalopram (LEXAPRO) 20 MG tablet Take 1 tablet (20 mg total) by mouth daily. 06/23/16   Elige Radon Dettinger, MD  lisdexamfetamine (VYVANSE) 40 MG capsule Take 1 capsule (40 mg total) by mouth daily. Fill on or after 07/24/2016 06/05/16   Elige Radon Dettinger, MD  lisdexamfetamine (VYVANSE) 40 MG capsule Take 1 capsule (40 mg total) by mouth daily. Fail on or after 06/23/2016 06/05/16   Elige Radon Dettinger, MD  omeprazole (PRILOSEC) 20 MG capsule Take 1 capsule (20 mg total) by mouth daily. 07/31/16   Elige Radon Dettinger, MD  PARAGARD INTRAUTERINE COPPER IUD IUD 1 each by Intrauterine route once.    Historical Provider, MD    Family History Family History  Problem Relation Age of Onset  . Hypertension Other   . Cancer Other   . Hypertension Father     Social History Social History  Substance Use Topics  . Smoking status: Never Smoker  . Smokeless tobacco: Never Used  . Alcohol use 0.6 oz/week    1 Glasses of wine per week     Comment: one per month   Allergies   Patient has  no known allergies.  Review of Systems Review of Systems  Constitutional: Negative for appetite change and fatigue.  HENT: Negative for congestion, ear discharge and sinus pressure.   Eyes: Negative for discharge.  Respiratory: Negative for cough.   Cardiovascular: Negative for chest pain.  Gastrointestinal: Positive for abdominal pain and nausea. Negative for diarrhea and vomiting.  Genitourinary: Negative for frequency and hematuria.  Musculoskeletal: Negative for back pain.  Skin: Negative for rash.  Neurological: Negative for seizures  and headaches.  Psychiatric/Behavioral: Negative for hallucinations.   Physical Exam Updated Vital Signs BP 129/81 (BP Location: Right Arm)   Pulse 75   Temp 99.2 F (37.3 C) (Oral)   Resp 14   Ht  (1.651 m)   Wt 129 lb (58.5 kg)   SpO2 99%   BMI 21.47 kg/m   Physical Exam  Constitutional: She is oriented to person, place, and time. She appears well-developed.  HENT:  Head: Normocephalic.  Eyes: Conjunctivae and EOM are normal. No scleral icterus.  Neck: Neck supple. No thyromegaly present.  Cardiovascular: Normal rate and regular rhythm.  Exam reveals no gallop and no friction rub.   No murmur heard. Pulmonary/Chest: No stridor. She has no wheezes. She has no rales. She exhibits no tenderness.  Abdominal: She exhibits no distension. There is tenderness. There is no rebound.  Minimal epigastric tenderness   Musculoskeletal: Normal range of motion. She exhibits no edema.  Lymphadenopathy:    She has no cervical adenopathy.  Neurological: She is oriented to person, place, and time. She exhibits normal muscle tone. Coordination normal.  Skin: No rash noted. No erythema.  Psychiatric: She has a normal mood and affect. Her behavior is normal.   ED Treatments / Results  DIAGNOSTIC STUDIES:  Oxygen Saturation is 99% on RA, normal by my interpretation.    COORDINATION OF CARE:  12:25 PM Discussed treatment plan with pt at bedside and pt agreed to plan.  Labs (all labs ordered are listed, but only abnormal results are displayed) Labs Reviewed - No data to display  EKG  EKG Interpretation None       Radiology No results found.  Procedures Procedures (including critical care time)  Medications Ordered in ED Medications - No data to display  Initial Impression / Assessment and Plan / ED Course  I have reviewed the triage vital signs and the nursing notes.  Pertinent labs & imaging results that were available during my care of the patient were reviewed by me  and considered in my medical decision making (see chart for details).    Patient with epigastric discomfort for a long time. She was put on a protein pump inhibitor for 2 weeks that did not help. She also has had an ultrasound that did not show gallbladder disease but recommendation was to repeat it. Labs were unremarkable today. Patient is to see GI in 2 weeks. She does not want anything for pain. She is getting a prescription for nausea medicine and will follow-up with the GI doctor to determine the rest of her treatment Final Clinical Impressions(s) / ED Diagnoses   Final diagnoses:  None    New Prescriptions New Prescriptions   No medications on file     Bethann Berkshire, MD 08/07/16 1524 The chart was scribed for me under my direct supervision.  I personally performed the history, physical, and medical decision making and all procedures in the evaluation of this patient.Bethann Berkshire, MD 08/07/16 [phone]

## 2016-08-07 NOTE — ED Triage Notes (Signed)
Pt reports that she was dx with stomach ulcers several years ago and was given medication. Medication doesn't seem to work. Described as dull abdominal pain and acid reflux. Feels nauseated and when she eats feesl if food doesn't want to go down

## 2016-08-07 NOTE — Discharge Instructions (Signed)
Follow up with the gi md as planned

## 2016-08-07 NOTE — ED Notes (Signed)
Pt was upset about leaving.  Stated she thought she needed something like an MRI done to test for cancer.  Informed she needed to follow up with GI and EGD would probably be the best option to start with.  After speaking with pt, she was comfortable with leaving.

## 2016-08-22 ENCOUNTER — Encounter (INDEPENDENT_AMBULATORY_CARE_PROVIDER_SITE_OTHER): Payer: Self-pay | Admitting: *Deleted

## 2016-08-22 ENCOUNTER — Encounter (INDEPENDENT_AMBULATORY_CARE_PROVIDER_SITE_OTHER): Payer: Self-pay | Admitting: Internal Medicine

## 2016-08-22 ENCOUNTER — Ambulatory Visit (INDEPENDENT_AMBULATORY_CARE_PROVIDER_SITE_OTHER): Payer: Self-pay | Admitting: Internal Medicine

## 2016-08-22 VITALS — BP 110/70 | HR 76 | Temp 98.2°F | Ht 64.0 in | Wt 132.7 lb

## 2016-08-22 DIAGNOSIS — R1013 Epigastric pain: Secondary | ICD-10-CM

## 2016-08-22 DIAGNOSIS — R110 Nausea: Secondary | ICD-10-CM

## 2016-08-22 NOTE — Patient Instructions (Addendum)
US abdomen. Samples of Nexium given to patient (20 day supply) OV in 2 months.

## 2016-08-22 NOTE — Progress Notes (Signed)
   Subjective:    Patient ID: Pamela BranchAmber Potts, female    DOB: [birth year], 26 y.o.   MRN: [id number]  HPI Referred by Dr Dettinger for GERD. Recently seen in the ED in April with epigastric pain. Nausea was associated with her symptoms. Per records has hx of gastric ulcers.  Pain worse while eating.   Symptoms x 3 years. Presently not taking a PPI for her GERD.  When she eats, she has nausea. She has early satiety. When she smells foods, her stomach will calm down. No NSAIDs.  She says she has lost about 10 pounds over the past month.  In January she weighed 131.8. Today her weight is 132.7.  She does avoid sodas.   No dysphagia.   Has a BM daily.   CBC    Component Value Date/Time   WBC 4.5 08/07/2016 1254   RBC 4.32 08/07/2016 1254   HGB 13.5 08/07/2016 1254   HCT 39.1 08/07/2016 1254   PLT 230 08/07/2016 1254   MCV 90.5 08/07/2016 1254   MCH 31.3 08/07/2016 1254   MCHC 34.5 08/07/2016 1254   RDW 12.1 08/07/2016 1254   LYMPHSABS 1.0 08/07/2016 1254   MONOABS 0.4 08/07/2016 1254   EOSABS 0.1 08/07/2016 1254   BASOSABS 0.0 08/07/2016 1254    Review of Systems Past Medical History:  Diagnosis Date  . ADHD   . History of stomach ulcers   . Medical history non-contributory     Past Surgical History:  Procedure Laterality Date  . right arm  2007   aztec nm    No Known Allergies  Current Outpatient Prescriptions on File Prior to Visit  Medication Sig Dispense Refill  . buPROPion (WELLBUTRIN XL) 150 MG 24 hr tablet Take 1 tablet (150 mg total) by mouth daily. 30 tablet 2  . escitalopram (LEXAPRO) 20 MG tablet Take 1 tablet (20 mg total) by mouth daily. 30 tablet 2  . PARAGARD INTRAUTERINE COPPER IUD IUD 1 each by Intrauterine route once.     No current facility-administered medications on file prior to visit.        Objective:   Physical Exam Blood pressure 110/70, pulse 76, temperature 98.2 F (36.8 C), height 5\' 4"  (1.626 m), weight 132 lb 11.2 oz (60.2  kg). Alert and oriented. Skin warm and dry. Oral mucosa is moist.   . Sclera anicteric, conjunctivae is pink. Thyroid not enlarged. No cervical lymphadenopathy. Lungs clear. Heart regular rate and rhythm.  Abdomen is soft. Bowel sounds are positive. No hepatomegaly. No abdominal masses felt. No tenderness.  No edema to lower extremities.  .        Assessment & Plan:  GERD. Am going to start her on Nexium 24. (She does not have insurance) US abdomen for epigastric pain, nausea.  OV in 2 months.  GERD diet given to patient.

## 2016-08-28 ENCOUNTER — Ambulatory Visit (HOSPITAL_COMMUNITY)
Admission: RE | Admit: 2016-08-28 | Discharge: 2016-08-28 | Disposition: A | Discharge status: Home / Self Care | Payer: Self-pay | Source: Ambulatory Visit | Attending: Internal Medicine | Admitting: Internal Medicine

## 2016-08-28 DIAGNOSIS — K824 Cholesterolosis of gallbladder: Secondary | ICD-10-CM | POA: Insufficient documentation

## 2016-08-28 DIAGNOSIS — R110 Nausea: Secondary | ICD-10-CM

## 2016-08-28 DIAGNOSIS — R1013 Epigastric pain: Secondary | ICD-10-CM

## 2016-09-09 ENCOUNTER — Encounter (INDEPENDENT_AMBULATORY_CARE_PROVIDER_SITE_OTHER): Payer: Self-pay | Admitting: Internal Medicine

## 2016-09-11 ENCOUNTER — Encounter (INDEPENDENT_AMBULATORY_CARE_PROVIDER_SITE_OTHER): Payer: Self-pay | Admitting: Internal Medicine

## 2016-09-12 ENCOUNTER — Telehealth (INDEPENDENT_AMBULATORY_CARE_PROVIDER_SITE_OTHER): Payer: Self-pay | Admitting: Internal Medicine

## 2016-09-12 NOTE — Telephone Encounter (Signed)
Patient called for Pamela Potts.  Stated that she was told not to take Nexium.  She wants to know what she can take or does she need to schedule an appointment to come in.  [phone]

## 2016-09-12 NOTE — Telephone Encounter (Signed)
Advised her to try Pirlosec OTC and see if that helps

## 2016-09-13 ENCOUNTER — Telehealth (INDEPENDENT_AMBULATORY_CARE_PROVIDER_SITE_OTHER): Payer: Self-pay | Admitting: Internal Medicine

## 2016-09-13 ENCOUNTER — Other Ambulatory Visit (INDEPENDENT_AMBULATORY_CARE_PROVIDER_SITE_OTHER): Payer: Self-pay | Admitting: Internal Medicine

## 2016-09-13 DIAGNOSIS — K219 Gastro-esophageal reflux disease without esophagitis: Secondary | ICD-10-CM | POA: Insufficient documentation

## 2016-09-13 NOTE — Telephone Encounter (Signed)
I talked with patient this am. Is not satisfied with taking Prilosec or Nexium due to the side effects. She did have some dizziness with the Nexium. Did not take the Prilosec.  Wants to proceed with an EGD.   Ann, EGD.

## 2016-09-13 NOTE — Telephone Encounter (Signed)
EGD sch'd 09/14/16, patient aware

## 2016-09-14 ENCOUNTER — Ambulatory Visit (HOSPITAL_COMMUNITY)
Admission: RE | Admit: 2016-09-14 | Discharge: 2016-09-14 | Disposition: A | Discharge status: Home / Self Care | Payer: Self-pay | Source: Ambulatory Visit | Attending: Internal Medicine | Admitting: Internal Medicine

## 2016-09-14 ENCOUNTER — Encounter (HOSPITAL_COMMUNITY): Payer: Self-pay | Admitting: *Deleted

## 2016-09-14 ENCOUNTER — Encounter (HOSPITAL_COMMUNITY)
Admission: RE | Disposition: A | Discharge status: Home / Self Care | Payer: Self-pay | Source: Ambulatory Visit | Attending: Internal Medicine

## 2016-09-14 DIAGNOSIS — R110 Nausea: Secondary | ICD-10-CM

## 2016-09-14 DIAGNOSIS — K219 Gastro-esophageal reflux disease without esophagitis: Secondary | ICD-10-CM

## 2016-09-14 DIAGNOSIS — K228 Other specified diseases of esophagus: Secondary | ICD-10-CM | POA: Insufficient documentation

## 2016-09-14 DIAGNOSIS — K319 Disease of stomach and duodenum, unspecified: Secondary | ICD-10-CM | POA: Insufficient documentation

## 2016-09-14 DIAGNOSIS — R1013 Epigastric pain: Secondary | ICD-10-CM | POA: Insufficient documentation

## 2016-09-14 DIAGNOSIS — Z8719 Personal history of other diseases of the digestive system: Secondary | ICD-10-CM | POA: Insufficient documentation

## 2016-09-14 HISTORY — DX: Gastro-esophageal reflux disease without esophagitis: K21.9

## 2016-09-14 HISTORY — PX: ESOPHAGOGASTRODUODENOSCOPY: SHX5428

## 2016-09-14 SURGERY — EGD (ESOPHAGOGASTRODUODENOSCOPY)
Anesthesia: Moderate Sedation

## 2016-09-14 MED ORDER — SODIUM CHLORIDE 0.9 % IV SOLN
INTRAVENOUS | Status: DC
  Administered 2016-09-14: 13:00:00 via INTRAVENOUS

## 2016-09-14 MED ORDER — LIDOCAINE VISCOUS 2 % MT SOLN
OROMUCOSAL | Status: DC | PRN
  Administered 2016-09-14: 1 via OROMUCOSAL

## 2016-09-14 MED ORDER — MIDAZOLAM HCL 5 MG/5ML IJ SOLN
INTRAMUSCULAR | Status: AC
  Filled 2016-09-14: qty 5

## 2016-09-14 MED ORDER — MEPERIDINE HCL 50 MG/ML IJ SOLN
INTRAMUSCULAR | Status: AC
  Filled 2016-09-14: qty 1

## 2016-09-14 MED ORDER — LIDOCAINE VISCOUS 2 % MT SOLN
OROMUCOSAL | Status: AC
  Filled 2016-09-14: qty 15

## 2016-09-14 MED ORDER — DICYCLOMINE HCL 10 MG PO CAPS: 10.0000 mg | ORAL_CAPSULE | Freq: Three times a day (TID) | ORAL | 2 refills | Status: DC

## 2016-09-14 MED ORDER — MIDAZOLAM HCL 5 MG/5ML IJ SOLN
INTRAMUSCULAR | Status: DC | PRN
  Administered 2016-09-14: 2 mg via INTRAVENOUS
  Administered 2016-09-14 (×3): 3 mg via INTRAVENOUS
  Administered 2016-09-14: 2 mg via INTRAVENOUS

## 2016-09-14 MED ORDER — MIDAZOLAM HCL 5 MG/5ML IJ SOLN
INTRAMUSCULAR | Status: AC
  Filled 2016-09-14: qty 10

## 2016-09-14 MED ORDER — MEPERIDINE HCL 50 MG/ML IJ SOLN
INTRAMUSCULAR | Status: DC | PRN
  Administered 2016-09-14 (×2): 25 mg via INTRAVENOUS

## 2016-09-14 NOTE — H&P (Signed)
Pamela Potts is an 26 y.o. female.   Chief Complaint: Patient is here for EGD. HPI: She is 26 year old Caucasian female who presents with chronic epigastric pain. She's had this pain for at least 4 years. Pain was initially every few days if not daily. She feels best after meals. She has had nausea without vomiting. She denies melena. Her bowels irregular. She does not take OTC NSAIDs. She was recently prescribed Nexium. It did provide some relief but she developed side effects. Therapy was discontinued. She had ultrasound on 08/28/2016 revealing 6 mm gallbladder polyp but no evidence of gallstones. CBC comprehensive chemistry panel was within normal limits about 6 weeks ago. She does not take NSAIDs. She does not smoke cigarettes. She drinks alcohol occasionally.   Past Medical History:  Diagnosis Date  . ADHD   . GERD (gastroesophageal reflux disease)   . History of stomach ulcers     Past Surgical History:  Procedure Laterality Date  . right arm  2007   aztec nm    Family History  Problem Relation Age of Onset  . Hypertension Other   . Cancer Other   . Hypertension Father    Social History:  reports that she has never smoked. She has never used smokeless tobacco. She reports that she drinks about 0.6 oz of alcohol per week . She reports that she does not use drugs.  Allergies:  Allergies  Allergen Reactions  . Nexium [Esomeprazole]     Dizziness     Medications Prior to Admission  Medication Sig Dispense Refill  . PARAGARD INTRAUTERINE COPPER IUD IUD 1 each by Intrauterine route once.    .    2  .    2    No results found for this or any previous visit (from the past 48 hour(s)). No results found.  ROS  Blood pressure 117/61, pulse 82, temperature 98 F (36.7 C), temperature source Oral, resp. rate 12, height 5\' 4"  (1.626 m), weight 135 lb (61.2 kg), SpO2 100 %. Physical Exam  Constitutional: She appears well-developed and well-nourished.  HENT:  Mouth/Throat:  Oropharynx is clear and moist.  Eyes: Conjunctivae are normal. No scleral icterus.  Cardiovascular: Normal rate, regular rhythm and normal heart sounds.   No murmur heard. Respiratory: Effort normal and breath sounds normal.  GI:  Abdomen is symmetrical and soft without tenderness again no megaly or masses.  Musculoskeletal: She exhibits no edema.  Neurological: She is alert.  Skin: Skin is warm and dry.     Assessment/Plan Chronic epigastric pain and nausea. Diagnostic EGD.  Lionel DecemberNajeeb Azia Toutant, MD 09/14/2016, 1:00 PM

## 2016-09-14 NOTE — Discharge Instructions (Signed)
Dicyclomine 10 mg by mouth 30 minutes before each meal. Resume usual diet. No driving for 24 hours. Physician will call with biopsy results and further recommendations.  Esophagogastroduodenoscopy, Care After Refer to this sheet in the next few weeks. These instructions provide you with information about caring for yourself after your procedure. Your health care provider may also give you more specific instructions. Your treatment has been planned according to current medical practices, but problems sometimes occur. Call your health care provider if you have any problems or questions after your procedure. What can I expect after the procedure? After the procedure, it is common to have:  A sore throat.  Nausea.  Bloating.  Dizziness.  Fatigue.  Follow these instructions at home:  Do not eat or drink anything until the numbing medicine (local anesthetic) has worn off and your gag reflex has returned. You will know that the local anesthetic has worn off when you can swallow comfortably.  Do not drive for 24 hours if you received a medicine to help you relax (sedative).  If your health care provider took a tissue sample for testing during the procedure, make sure to get your test results. This is your responsibility. Ask your health care provider or the department performing the test when your results will be ready.  Keep all follow-up visits as told by your health care provider. This is important. Contact a health care provider if:  You cannot stop coughing.  You are not urinating.  You are urinating less than usual. Get help right away if:  You have trouble swallowing.  You cannot eat or drink.  You have throat or chest pain that gets worse.  You are dizzy or light-headed.  You faint.  You have nausea or vomiting.  You have chills.  You have a fever.  You have severe abdominal pain.  You have black, tarry, or bloody stools. This information is not intended to  replace advice given to you by your health care provider. Make sure you discuss any questions you have with your health care provider. Document Released: 03/13/2012 Document Revised: 09/02/2015 Document Reviewed: 02/18/2015 Elsevier Interactive Patient Education  [employer]2018 [employer]

## 2016-09-14 NOTE — Progress Notes (Signed)
error 

## 2016-09-14 NOTE — Progress Notes (Signed)
Please excuse Pamela BranchAmber Potts from work on September 15, 2016, due to procedure performed at [facility] that required sedation.  She is not allowed to drive for 24 hours, make important decisions, or sign any important papers.  If any questions please call.

## 2016-09-14 NOTE — Op Note (Signed)
[facility] Patient Name: Pamela Potts Procedure Date: 09/14/2016 12:48 PM MRN: [id number] Date of Birth: [birth year] Attending MD: Lionel December , MD CSN: [id number] Age: 26 Admit Type: Outpatient Procedure:                Upper GI endoscopy Indications:              Epigastric abdominal pain, Nausea Providers:                Lionel December, MD, Jannett Celestine, RN, Edrick Kins, RN Referring MD:             Elige Radon. Dettinger, MD Medicines:                Lidocaine spray, Meperidine 50 mg IV, Midazolam 13                            mg IV Complications:            No immediate complications. Estimated Blood Loss:     Estimated blood loss was minimal. Procedure:                Pre-Anesthesia Assessment:                           - Prior to the procedure, a History and Physical                            was performed, and patient medications and                            allergies were reviewed. The patient's tolerance of                            previous anesthesia was also reviewed. The risks                            and benefits of the procedure and the sedation                            options and risks were discussed with the patient.                            All questions were answered, and informed consent                            was obtained. Prior Anticoagulants: The patient has                            taken no previous anticoagulant or antiplatelet                            agents. ASA Grade Assessment: I - A normal, healthy                            patient. After reviewing the risks and benefits,  the patient was deemed in satisfactory condition to                            undergo the procedure.                           After obtaining informed consent, the endoscope was                            passed under direct vision. Throughout the                            procedure, the patient's blood pressure, pulse, and                     oxygen saturations were monitored continuously. The                            EG-[id number] ([id number]) scope was introduced through the                            mouth, and advanced to the second part of duodenum.                            The upper GI endoscopy was accomplished without                            difficulty. The patient tolerated the procedure                            well. Scope In: 1:12:55 PM Scope Out: 1:20:06 PM Total Procedure Duration: 0 hours 7 minutes 11 seconds  Findings:      The examined esophagus was normal.      The Z-line was irregular and was found 39 cm from the incisors.      The entire examined stomach was normal. Biopsies were taken with a cold       forceps for histology.      The duodenal bulb and second portion of the duodenum were normal. Impression:               - Normal esophagus.                           - Z-line irregular, 39 cm from the incisors.                           - Normal stomach. Biopsied.                           - Normal duodenal bulb and second portion of the                            duodenum. Moderate Sedation:      Moderate (conscious) sedation was administered by the endoscopy nurse       and supervised by the endoscopist. The following parameters were       monitored:  oxygen saturation, heart rate, blood pressure, CO2       capnography and response to care. Total physician intraservice time was       15 minutes. Recommendation:           - Patient has a contact number available for                            emergencies. The signs and symptoms of potential                            delayed complications were discussed with the                            patient. Return to normal activities tomorrow.                            Written discharge instructions were provided to the                            patient.                           - Resume previous diet today.                           -  Continue present medications.                           - Dicyclomine 10 mg by mouth 30 minutes beore each                            meal.                           - Await pathology results. Procedure Code(s):        --- Professional ---                           [phone], Esophagogastroduodenoscopy, flexible,                            transoral; with biopsy, single or multiple                           99152, Moderate sedation services provided by the                            same physician or other qualified health care                            professional performing the diagnostic or                            therapeutic service that the sedation supports,  requiring the presence of an independent trained                            observer to assist in the monitoring of the                            patient's level of consciousness and physiological                            status; initial 15 minutes of intraservice time,                            patient age 59 years or older Diagnosis Code(s):        --- Professional ---                           K22.8, Other specified diseases of esophagus                           R10.13, Epigastric pain                           R11.0, Nausea CPT copyright 2016 American Medical Association. All rights reserved. The codes documented in this report are preliminary and upon coder review may  be revised to meet current compliance requirements. Lionel DecemberNajeeb Rehman, MD Lionel DecemberNajeeb Rehman, MD 09/14/2016 1:31:03 PM This report has been signed electronically. Number of Addenda: 0

## 2016-09-20 ENCOUNTER — Encounter (HOSPITAL_COMMUNITY): Payer: Self-pay | Admitting: Internal Medicine

## 2016-09-25 ENCOUNTER — Ambulatory Visit (INDEPENDENT_AMBULATORY_CARE_PROVIDER_SITE_OTHER): Payer: Self-pay | Admitting: Family Medicine

## 2016-09-25 ENCOUNTER — Ambulatory Visit (INDEPENDENT_AMBULATORY_CARE_PROVIDER_SITE_OTHER): Payer: Self-pay

## 2016-09-25 ENCOUNTER — Encounter: Payer: Self-pay | Admitting: Family Medicine

## 2016-09-25 VITALS — BP 95/66 | HR 90 | Temp 97.1°F | Ht 64.0 in | Wt 130.4 lb

## 2016-09-25 DIAGNOSIS — F411 Generalized anxiety disorder: Secondary | ICD-10-CM

## 2016-09-25 DIAGNOSIS — R102 Pelvic and perineal pain: Secondary | ICD-10-CM

## 2016-09-25 DIAGNOSIS — K219 Gastro-esophageal reflux disease without esophagitis: Secondary | ICD-10-CM

## 2016-09-25 DIAGNOSIS — F902 Attention-deficit hyperactivity disorder, combined type: Secondary | ICD-10-CM

## 2016-09-25 NOTE — Progress Notes (Signed)
BP 95/66   Pulse 90   Temp 97.1 F (36.2 C) (Oral)   Ht 5\' 4"  (1.626 m)   Wt 130 lb 6 oz (59.1 kg)   BMI 22.38 kg/m    Subjective:    Patient ID: Pamela Potts, female    DOB: 16-Mar-1991, 26 y.o.   MRN: [id number]  HPI: Pamela Potts is a 26 y.o. female presenting on 09/25/2016 for Anxiety (followup); ADHD; and Discuss birth control (was STD tested at [facility] one month ago, they were unable to find IUD)   HPI Anxiety and depression and ADHD Patient is coming today for anxiety and depression and ADHD recheck and says she is doing very well with these and does not need any medication currently. She denies any suicidal ideations or feelings of sadness or depression. She is very happy with where she is at currently. Depression screen [facility] 2/9 09/25/2016 06/23/2016 05/26/2016 04/20/2016  Decreased Interest 0 2 1 2   Down, Depressed, Hopeless 0 1 2 1   PHQ - 2 Score 0 3 3 3   Altered sleeping - 2 2 2   Tired, decreased energy - 2 3 2   Change in appetite - 2 1 3   Feeling bad or failure about yourself  - 2 2 2   Trouble concentrating - 2 3 0  Moving slowly or fidgety/restless - 0 2 0  Suicidal thoughts - 0 1 0  PHQ-9 Score - 13 17 12   Difficult doing work/chores - Somewhat difficult - -     Pelvic cramping and GERD Patient has been treated for an STD about 1 month ago, gonorrhea and is coming today because over the past 2 days she has had pelvic cramping and low back pains. He denies any dysuria or hematuria or fevers or chills or vaginal irritation or burning or dryness. She was concerned because they have told her that the IUD was not visible strings and she wanted to know where it could be. She does not know if she may be starting her period but it is usually around this time, but she does start it. She has been fighting GERD for some time and was recently diagnosed with possible IBS as well. She denies any fevers or chills or nausea or vomiting. She does have a burning and  indigestion that is worse in the morning and especially on the mornings when she does not eat. She has reduced her coffee intake over the past month and she feels like she is having less and less GERD.  Relevant past medical, surgical, family and social history reviewed and updated as indicated. Interim medical history since our last visit reviewed. Allergies and medications reviewed and updated.  Review of Systems  Constitutional: Negative for chills and fever.  HENT: Negative for congestion, ear discharge and ear pain.   Eyes: Negative for redness and visual disturbance.  Respiratory: Negative for chest tightness and shortness of breath.   Cardiovascular: Negative for chest pain and leg swelling.  Gastrointestinal: Positive for abdominal pain. Negative for constipation, diarrhea, nausea and vomiting.  Genitourinary: Positive for pelvic pain. Negative for difficulty urinating, dysuria, vaginal bleeding, vaginal discharge and vaginal pain.  Musculoskeletal: Negative for back pain and gait problem.  Skin: Negative for rash.  Neurological: Negative for light-headedness and headaches.  Psychiatric/Behavioral: Negative for agitation and behavioral problems.  All other systems reviewed and are negative.   Per HPI unless specifically indicated above     Objective:    BP 95/66   Pulse 90  Temp 97.1 F (36.2 C) (Oral)   Ht 5\' 4"  (1.626 m)   Wt 130 lb 6 oz (59.1 kg)   BMI 22.38 kg/m   Wt Readings from Last 3 Encounters:  09/25/16 130 lb 6 oz (59.1 kg)  09/14/16 135 lb (61.2 kg)  08/22/16 132 lb 11.2 oz (60.2 kg)    Physical Exam  Constitutional: She is oriented to person, place, and time. She appears well-developed and well-nourished. No distress.  Eyes: Conjunctivae are normal.  Neck: Neck supple.  Cardiovascular: Normal rate, regular rhythm, normal heart sounds and intact distal pulses.   No murmur heard. Pulmonary/Chest: Effort normal and breath sounds normal. No respiratory  distress. She has no wheezes. She has no rales.  Abdominal: Soft. Bowel sounds are normal. She exhibits no distension. There is no hepatosplenomegaly. There is tenderness in the epigastric area. There is no rigidity, no rebound, no guarding and no CVA tenderness.  Musculoskeletal: Normal range of motion.  Neurological: She is alert and oriented to person, place, and time. Coordination normal.  Skin: Skin is warm and dry. No rash noted. She is not diaphoretic.  Psychiatric: She has a normal mood and affect. Her behavior is normal.  Nursing note and vitals reviewed.      Assessment & Plan:   Problem List Items Addressed This Visit      Digestive   Gastroesophageal reflux disease without esophagitis     Other   Attention deficit hyperactivity disorder (ADHD), combined type    Patient is doing well in regards to her anxiety and emotions and inattention and does not require any medications today.      Generalized anxiety disorder - Primary    Other Visit Diagnoses    Pelvic cramping       IUD appears in place, patient was treated for STDs already, recommended for her to get rechecked in 2-3 months if she wants, is seen GI for GERD   Relevant Orders   DG Pelvis 1-2 Views (Completed)     Patient will take Zantac when necessary for GERD and was also given Bentyl for possible IBS from gastroenterology. She had EGD which was normal.   Follow up plan: Return if symptoms worsen or fail to improve.  Counseling provided for all of the vaccine components Orders Placed This Encounter  Procedures  . DG Pelvis 1-2 Views    Arville CareJoshua Juliani Laduke, MD [facility] 09/25/2016, 9:17 AM

## 2016-09-25 NOTE — Assessment & Plan Note (Signed)
Patient is doing well in regards to her anxiety and emotions and inattention and does not require any medications today.

## 2016-10-21 ENCOUNTER — Encounter: Payer: Self-pay | Admitting: Family Medicine

## 2016-10-21 ENCOUNTER — Ambulatory Visit (INDEPENDENT_AMBULATORY_CARE_PROVIDER_SITE_OTHER): Payer: Self-pay | Admitting: Family Medicine

## 2016-10-21 VITALS — BP 108/77 | HR 68 | Temp 97.2°F | Ht 64.0 in | Wt 138.4 lb

## 2016-10-21 DIAGNOSIS — M25531 Pain in right wrist: Secondary | ICD-10-CM

## 2016-10-21 MED ORDER — PREDNISONE 20 MG PO TABS: 40.0000 mg | ORAL_TABLET | Freq: Every day | ORAL | 0 refills | Status: DC

## 2016-10-21 NOTE — Progress Notes (Signed)
   HPI  Patient presents today for wrist pain  Patient explains that she had a similar illness months ago. She take anti-inflammatories and got better. She's been better for months. She denies any discrete injury or overuse.  She states that about one week ago she started developing posterior right wrist pain again. She's been taking over-the-counter anti-inflammatories, she does not know the name of medication. She started wearing a brace one day ago and started medication and felt much better last night. She applied ice last night and woke up this morning with excruciating pain.    PMH: Smoking status noted ROS: Per HPI  Objective: BP 108/77   Pulse 68   Temp (!) 97.2 F (36.2 C) (Oral)   Ht 5\' 4"  (1.626 m)   Wt 138 lb 6.4 oz (62.8 kg)   BMI 23.76 kg/m  Gen: NAD, alert, cooperative with exam HEENT: NCAT CV: RRR, good S1/S2, no murmur Resp: CTABL, no wheezes, non-labored Ext: No edema, warm Neuro: Alert and oriented, No gross deficits MSK:  Right wrist with mild swelling diffusely extending down to the right MCPs. No gross deformity, full range of motion. Tenderness to palpation of the anatomic snuffbox which is mild compared to the tenderness along the distal ends of second through third metacarpals.   Assessment and plan:  # Right wrist pain Unusual presentation, given her age and recurrent pain in the same area I am concerned about rheumatism or other inflammatory arthritis. Prednisone Continue wearing cock-up splint Return to the clinic for x-ray during the week, this is Saturday clinic.      Orders Placed This Encounter  Procedures  . DG Wrist Complete Right    Standing Status:   Future    Standing Expiration Date:   12/22/2017    Order Specific Question:   Reason for Exam (SYMPTOM  OR DIAGNOSIS REQUIRED)    Answer:   R wrist pain    Order Specific Question:   Is patient pregnant?    Answer:   No    Order Specific Question:   Preferred imaging location?    Answer:   Internal    Order Specific Question:   Radiology Contrast Protocol - do NOT remove file path    Answer:   [email or web address]    Meds ordered this encounter  Medications  . predniSONE (DELTASONE) 20 MG tablet    Sig: Take 2 tablets (40 mg total) by mouth daily with breakfast.    Dispense:  10 tablet    Refill:  0    Murtis SinkSam Samvel Zinn, MD [facility] 10/21/2016, 9:34 AM

## 2016-10-21 NOTE — Patient Instructions (Signed)
Great to meet you!  Continue wearing your brace  Start prednisone this morning with food.   You amy also take 2 aleve twice daily for a week   Call back if you are not improving.

## 2016-10-21 NOTE — Telephone Encounter (Signed)
Patient seen in clinic with provider today.

## 2016-10-23 ENCOUNTER — Ambulatory Visit (INDEPENDENT_AMBULATORY_CARE_PROVIDER_SITE_OTHER): Payer: Self-pay | Admitting: Internal Medicine

## 2016-10-23 ENCOUNTER — Encounter (INDEPENDENT_AMBULATORY_CARE_PROVIDER_SITE_OTHER): Payer: Self-pay | Admitting: Internal Medicine

## 2016-10-25 ENCOUNTER — Encounter: Payer: Self-pay | Admitting: Family Medicine

## 2016-10-25 ENCOUNTER — Telehealth: Payer: Self-pay | Admitting: Family Medicine

## 2016-10-25 MED ORDER — PREDNISONE 20 MG PO TABS: ORAL_TABLET | ORAL | 0 refills | Status: DC

## 2016-10-25 NOTE — Telephone Encounter (Signed)
Left detailed message regarding RX 

## 2016-10-27 ENCOUNTER — Ambulatory Visit (INDEPENDENT_AMBULATORY_CARE_PROVIDER_SITE_OTHER): Payer: Self-pay | Admitting: Family Medicine

## 2016-10-27 ENCOUNTER — Encounter: Payer: Self-pay | Admitting: Family Medicine

## 2016-10-27 ENCOUNTER — Ambulatory Visit (INDEPENDENT_AMBULATORY_CARE_PROVIDER_SITE_OTHER): Payer: Self-pay

## 2016-10-27 VITALS — BP 110/72 | HR 73 | Temp 97.8°F | Ht 64.0 in | Wt 137.0 lb

## 2016-10-27 DIAGNOSIS — M25531 Pain in right wrist: Secondary | ICD-10-CM

## 2016-10-27 MED ORDER — TRAMADOL HCL 50 MG PO TABS: 50.0000 mg | ORAL_TABLET | Freq: Three times a day (TID) | ORAL | 0 refills | Status: DC | PRN

## 2016-10-27 NOTE — Progress Notes (Signed)
BP 110/72   Pulse 73   Temp 97.8 F (36.6 C) (Oral)   Ht 5\' 4"  (1.626 m)   Wt 137 lb (62.1 kg)   BMI 23.52 kg/m    Subjective:    Patient ID: Pamela Potts, female    DOB: 10-11-1990, 26 y.o.   MRN: [id number]  HPI: Pamela Potts is a 26 y.o. female presenting on 10/27/2016 for Right wrist pain (does not feel that it has improved at all; needs work note )   HPI Right wrist pain. Patient is coming in with continued and worsened right wrist pain is been going on for about a week now. She was seen by one of our providers about 4 days ago on a Saturday so there was no x-ray. She was given prednisone but the wrist pain has worsened since she stopped the prednisone and is worse than what it was before. She has pain on the posterior aspect of her wrist extending up through the third and fourth digits and she has significant pain with extension of those digits but flexion has less pain. She feels like she cannot fully extend her third and fourth digits. She denies any fevers or chills or redness or warmth. She does have some swelling over the hand and wrist. She is sexually active but has had the same partner for quite some time and denies any risk for STDs or vaginal discharge. She rates the pain is 9 out of 10. She took ibuprofen and it did not seem to help.  Relevant past medical, surgical, family and social history reviewed and updated as indicated. Interim medical history since our last visit reviewed. Allergies and medications reviewed and updated.  Review of Systems  Constitutional: Negative for chills and fever.  Respiratory: Negative for chest tightness and shortness of breath.   Cardiovascular: Negative for chest pain and leg swelling.  Gastrointestinal: Negative for abdominal pain.  Genitourinary: Negative for vaginal discharge and vaginal pain.  Musculoskeletal: Positive for arthralgias. Negative for back pain and gait problem.  Skin: Negative for rash.  Neurological: Negative  for light-headedness and headaches.  Psychiatric/Behavioral: Negative for agitation and behavioral problems.  All other systems reviewed and are negative.   Per HPI unless specifically indicated above        Objective:    BP 110/72   Pulse 73   Temp 97.8 F (36.6 C) (Oral)   Ht 5\' 4"  (1.626 m)   Wt 137 lb (62.1 kg)   BMI 23.52 kg/m   Wt Readings from Last 3 Encounters:  10/27/16 137 lb (62.1 kg)  10/21/16 138 lb 6.4 oz (62.8 kg)  09/25/16 130 lb 6 oz (59.1 kg)    Physical Exam  Constitutional: She is oriented to person, place, and time. She appears well-developed and well-nourished. No distress.  Eyes: Conjunctivae are normal.  Cardiovascular: Normal rate, regular rhythm, normal heart sounds and intact distal pulses.   No murmur heard. Pulmonary/Chest: Effort normal and breath sounds normal. No respiratory distress. She has no wheezes. She has no rales.  Abdominal: Soft. Bowel sounds are normal. She exhibits no distension. There is no tenderness.  Musculoskeletal: Normal range of motion. She exhibits tenderness. She exhibits no edema.       Right wrist: She exhibits tenderness (Tenderness over posterior aspect of medial wrist. Extending up through the third and fourth digits and especially in the joints of the third and fourth digits on the hand. Patient says she has limited extension of the third and  fourth digit because of the ), bony tenderness and swelling. She exhibits normal range of motion, no crepitus and no deformity.  Neurological: She is alert and oriented to person, place, and time. Coordination normal.  Skin: Skin is warm and dry. No rash noted. She is not diaphoretic.  Psychiatric: She has a normal mood and affect. Her behavior is normal.  Nursing note and vitals reviewed.   Right wrist x-ray: No bony abnormalities, the angle of the x-ray because of her hand being tilted shows the overlap.    Assessment & Plan:   Problem List Items Addressed This Visit     None    Visit Diagnoses    Right wrist pain    -  Primary   X-ray read as normal by radiologist, discussed case with orthopedic and he says okay to try steroids and follow up in future if needed   Relevant Medications   traMADol (ULTRAM) 50 MG tablet   Other Relevant Orders   CBC with Differential/Platelet   Sedimentation rate   High sensitivity CRP   Arthritis Panel   Rheumatoid factor      Case was discussed with an orthopedic physician that specializes in hand. He says the x-ray looks perfectly good to him and thinks it may be more of an inflammatory issue and recommended doing oral anti-inflammatories such as prednisone and then coming to him if it is not improved.  Follow up plan: Return if symptoms worsen or fail to improve.  Counseling provided for all of the vaccine components Orders Placed This Encounter  Procedures  . CBC with Differential/Platelet  . Sedimentation rate  . High sensitivity CRP  . Arthritis Panel  . Rheumatoid factor    Arville CareJoshua Dettinger, MD [facility] 10/27/2016, 1:46 PM

## 2016-10-28 LAB — ARTHRITIS PANEL
Basophils Absolute: 0 10*3/uL (ref 0.0–0.2)
Basos: 1 %
EOS (ABSOLUTE): 0.1 10*3/uL (ref 0.0–0.4)
EOS: 1 %
HEMOGLOBIN: 13.5 g/dL (ref 11.1–15.9)
Hematocrit: 37.9 % (ref 34.0–46.6)
Immature Grans (Abs): 0 10*3/uL (ref 0.0–0.1)
Immature Granulocytes: 1 %
Lymphocytes Absolute: 2.8 10*3/uL (ref 0.7–3.1)
Lymphs: 35 %
MCH: 30.8 pg (ref 26.6–33.0)
MCHC: 35.6 g/dL (ref 31.5–35.7)
MCV: 86 fL (ref 79–97)
MONOCYTES: 9 %
Monocytes Absolute: 0.7 10*3/uL (ref 0.1–0.9)
NEUTROS ABS: 4.4 10*3/uL (ref 1.4–7.0)
Neutrophils: 53 %
Platelets: 255 10*3/uL (ref 150–379)
RBC: 4.39 x10E6/uL (ref 3.77–5.28)
RDW: 13.1 % (ref 12.3–15.4)
Rhuematoid fact SerPl-aCnc: <10 IU/mL (ref 0.0–13.9)
SED RATE: 2 mm/h (ref 0–32)
Uric Acid: 4.3 mg/dL (ref 2.5–7.1)
WBC: 8 10*3/uL (ref 3.4–10.8)

## 2016-10-28 LAB — HIGH SENSITIVITY CRP: CRP HIGH SENSITIVITY: 0.7 mg/L (ref 0.00–3.00)

## 2016-10-31 ENCOUNTER — Ambulatory Visit: Payer: Self-pay | Admitting: Family Medicine

## 2016-10-31 NOTE — Addendum Note (Signed)
Addended by: Tamera PuntWRAY, WENDY S on: 10/31/2016 09:08 AM   Modules accepted: Orders

## 2016-12-10 ENCOUNTER — Encounter: Payer: Self-pay | Admitting: Family Medicine

## 2016-12-12 ENCOUNTER — Other Ambulatory Visit: Payer: Self-pay | Admitting: Family

## 2017-01-13 ENCOUNTER — Other Ambulatory Visit (INDEPENDENT_AMBULATORY_CARE_PROVIDER_SITE_OTHER): Payer: Self-pay | Admitting: Internal Medicine

## 2017-01-15 NOTE — Telephone Encounter (Signed)
Patient needs to have office visit prior to further refills. She may get these refills from her PCP if she chooses.

## 2017-03-09 ENCOUNTER — Encounter (INDEPENDENT_AMBULATORY_CARE_PROVIDER_SITE_OTHER): Payer: Self-pay | Admitting: Internal Medicine

## 2017-03-09 NOTE — Telephone Encounter (Signed)
Patient was given an appointment for 03/26/17 at 8:30am.  A letter was mailed to the patient.

## 2017-03-26 ENCOUNTER — Encounter (INDEPENDENT_AMBULATORY_CARE_PROVIDER_SITE_OTHER): Payer: Self-pay | Admitting: Internal Medicine

## 2017-03-26 ENCOUNTER — Ambulatory Visit (INDEPENDENT_AMBULATORY_CARE_PROVIDER_SITE_OTHER): Payer: Self-pay | Admitting: Internal Medicine

## 2017-04-24 ENCOUNTER — Encounter: Payer: Self-pay | Admitting: Family Medicine

## 2017-05-02 ENCOUNTER — Encounter: Payer: Self-pay | Admitting: Family Medicine

## 2017-05-02 ENCOUNTER — Ambulatory Visit (INDEPENDENT_AMBULATORY_CARE_PROVIDER_SITE_OTHER): Payer: Self-pay | Admitting: Family Medicine

## 2017-05-02 VITALS — BP 108/68 | HR 75 | Temp 98.1°F | Ht 64.0 in | Wt 155.0 lb

## 2017-05-02 DIAGNOSIS — F411 Generalized anxiety disorder: Secondary | ICD-10-CM

## 2017-05-02 DIAGNOSIS — F902 Attention-deficit hyperactivity disorder, combined type: Secondary | ICD-10-CM

## 2017-05-02 MED ORDER — AMPHETAMINE-DEXTROAMPHET ER 30 MG PO CP24: 30.0000 mg | ORAL_CAPSULE | Freq: Every day | ORAL | 0 refills | Status: DC

## 2017-05-02 MED ORDER — AMPHETAMINE-DEXTROAMPHET ER 30 MG PO CP24
30.0000 mg | ORAL_CAPSULE | Freq: Every day | ORAL | 0 refills | Status: DC
Start: 2017-05-02 — End: 2017-07-18

## 2017-05-02 NOTE — Progress Notes (Signed)
BP 108/68   Pulse 75   Temp 98.1 F (36.7 C) (Oral)   Ht 5\' 4"  (1.626 m)   Wt 155 lb (70.3 kg)   BMI 26.61 kg/m    Subjective:    Patient ID: Pamela Potts, female    DOB: Aug 09, 1990, 27 y.o.   MRN: [id number]  HPI: Pamela Potts is a 27 y.o. female presenting on 05/02/2017 for ADHD follow up (had lost job and insurance and has since found new job and waiting on insurance, states there is a coupon card available now where it is affordable)   HPI ADHD and anxiety Patient is coming in for ADHD and anxiety.  She says is mostly her ADHD and Effexor.  She is at work and she is trying to count pills and she will frequently lose focus and have trouble staying on task and concentrating.  This is been something that she was treated for as a child and she had wanted to go on medications last time affordable.  She is coming in because she just cannot take and she is worried about it affecting her ability to keep her job and is ready to try starting on something.  She still does not have insurance but had been resistant to go on Adderall before but is now willing to try it.  She denies any suicidal ideations or thoughts of hurting herself.  Relevant past medical, surgical, family and social history reviewed and updated as indicated. Interim medical history since our last visit reviewed. Allergies and medications reviewed and updated.  Review of Systems  Constitutional: Negative for chills and fever.  Eyes: Negative for visual disturbance.  Respiratory: Negative for chest tightness and shortness of breath.   Cardiovascular: Negative for chest pain and leg swelling.  Musculoskeletal: Negative for back pain and gait problem.  Skin: Negative for rash.  Neurological: Negative for light-headedness and headaches.  Psychiatric/Behavioral: Positive for decreased concentration. Negative for agitation, behavioral problems, self-injury, sleep disturbance and suicidal ideas. The patient is not hyperactive.    All other systems reviewed and are negative.   Per HPI unless specifically indicated above     Objective:    BP 108/68   Pulse 75   Temp 98.1 F (36.7 C) (Oral)   Ht 5\' 4"  (1.626 m)   Wt 155 lb (70.3 kg)   BMI 26.61 kg/m   Wt Readings from Last 3 Encounters:  05/02/17 155 lb (70.3 kg)  10/27/16 137 lb (62.1 kg)  10/21/16 138 lb 6.4 oz (62.8 kg)    Physical Exam  Constitutional: She is oriented to person, place, and time. She appears well-developed and well-nourished. No distress.  Eyes: Conjunctivae are normal.  Neck: Neck supple. No thyromegaly present.  Cardiovascular: Normal rate, regular rhythm, normal heart sounds and intact distal pulses.  No murmur heard. Pulmonary/Chest: Effort normal and breath sounds normal. No respiratory distress. She has no wheezes.  Musculoskeletal: Normal range of motion. She exhibits no edema or tenderness.  Lymphadenopathy:    She has no cervical adenopathy.  Neurological: She is alert and oriented to person, place, and time. Coordination normal.  Skin: Skin is warm and dry. No rash noted. She is not diaphoretic.  Psychiatric: She has a normal mood and affect. Her behavior is normal.  Nursing note and vitals reviewed.     Assessment & Plan:   Problem List Items Addressed This Visit      Other   Attention deficit hyperactivity disorder (ADHD), combined type - Primary  Relevant Medications   amphetamine-dextroamphetamine (ADDERALL XR) 30 MG 24 hr capsule   amphetamine-dextroamphetamine (ADDERALL XR) 30 MG 24 hr capsule   amphetamine-dextroamphetamine (ADDERALL XR) 30 MG 24 hr capsule   Generalized anxiety disorder      Follow up plan: Return in about 3 months (around 07/31/2017), or if symptoms worsen or fail to improve, for ADHD refill.  Counseling provided for all of the vaccine components No orders of the defined types were placed in this encounter.   Arville Care, MD [facility] 05/02/2017, 9:19  AM

## 2017-05-04 ENCOUNTER — Telehealth: Payer: Self-pay

## 2017-05-04 NOTE — Telephone Encounter (Signed)
Patient declined services reports that she only needed to get her medication refilled by her PCP.  Updated PHQ9 and GAD7 completed in epic.

## 2017-06-04 ENCOUNTER — Encounter: Payer: Self-pay | Admitting: Family Medicine

## 2017-06-04 DIAGNOSIS — F902 Attention-deficit hyperactivity disorder, combined type: Secondary | ICD-10-CM

## 2017-06-06 MED ORDER — AMPHETAMINE-DEXTROAMPHET ER 20 MG PO CP24: 40.0000 mg | ORAL_CAPSULE | Freq: Every day | ORAL | 0 refills | Status: DC

## 2017-06-06 NOTE — Telephone Encounter (Signed)
Increase dose to 40 mg, patient will need to be seen before next refill.

## 2017-07-18 ENCOUNTER — Encounter: Payer: Self-pay | Admitting: Family Medicine

## 2017-07-18 ENCOUNTER — Ambulatory Visit (INDEPENDENT_AMBULATORY_CARE_PROVIDER_SITE_OTHER): Payer: Self-pay | Admitting: Family Medicine

## 2017-07-18 VITALS — BP 115/76 | HR 79 | Temp 98.2°F | Ht 64.0 in | Wt 141.0 lb

## 2017-07-18 DIAGNOSIS — F411 Generalized anxiety disorder: Secondary | ICD-10-CM

## 2017-07-18 DIAGNOSIS — F902 Attention-deficit hyperactivity disorder, combined type: Secondary | ICD-10-CM

## 2017-07-18 MED ORDER — HYDROXYZINE HCL 25 MG PO TABS: 25.0000 mg | ORAL_TABLET | Freq: Three times a day (TID) | ORAL | 3 refills | Status: DC | PRN

## 2017-07-18 MED ORDER — AMPHETAMINE-DEXTROAMPHET ER 25 MG PO CP24: 50.0000 mg | ORAL_CAPSULE | ORAL | 0 refills | Status: DC

## 2017-07-18 MED ORDER — AMPHETAMINE-DEXTROAMPHET ER 25 MG PO CP24: 25.0000 mg | ORAL_CAPSULE | ORAL | 0 refills | Status: DC

## 2017-07-18 MED ORDER — DICYCLOMINE HCL 10 MG PO CAPS: 10.0000 mg | ORAL_CAPSULE | Freq: Three times a day (TID) | ORAL | 1 refills | Status: DC

## 2017-07-18 NOTE — Progress Notes (Signed)
BP 115/76   Pulse 79   Temp 98.2 F (36.8 C) (Oral)   Ht 5\' 4"  (1.626 m)   Wt 141 lb (64 kg)   BMI 24.20 kg/m    Subjective:    Patient ID: Pamela BranchAmber Potts, female    DOB: 1990-11-22, 27 y.o.   MRN: [id number]  HPI: Pamela Potts is a 27 y.o. female presenting on 07/18/2017 for ADHD follow up; Chest pains (sporadically, scares her and pain worsens ); and Crying more frequently, fatigue   HPI ADHD and anxiety and mood Patient is coming in today for ADHD and anxiety.  She says that the Adderall 40 that she is taking currently is just not doing it for her, it is helping but not near as much as the Vyvanse was previously.  Of note she is down on weight and we talked about her being in a healthy weight now but if she continues to lose weight it may not be going towards healthy in the future..  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  She says she did fail her school exam recently and that has been affecting her some as well but she thinks that was more a byproduct of everything else that has been occurring and her feeling down and her focus and attention being off.  She denies any suicidal ideations or thoughts of hurting herself. [facility] 2[facility] 07/18/2017 07/18/2017 05/04/2017 05/02/2017 10/27/2016  Decreased Interest 1 0 1 2 0  Down, Depressed, Hopeless 1 0 0 1 0  PHQ - 2 Score 2 0 1 3 0  Altered sleeping 1 - 1 0 -  Tired, decreased energy 3 - 0 2 -  Change in appetite 0 - 1 0 -  Feeling bad or failure about yourself  1 - 0 2 -  Trouble concentrating 2 - 0 3 -  Moving slowly or fidgety/restless 0 - - 2 -  Suicidal thoughts 0 - 0 1 -  PHQ-9 Score 9 - 3 13 -  Difficult doing work/chores - - - Somewhat difficult -      Relevant past medical, surgical, family and social history reviewed and updated as indicated. Interim medical history since our last visit reviewed. Allergies and medications reviewed and updated.  Review of Systems  Constitutional: Negative for chills and fever.  Eyes: Negative for visual disturbance.  Respiratory: Negative for chest tightness and shortness of breath.   Cardiovascular: Negative for chest pain and leg swelling.  Musculoskeletal: Negative for back pain and gait problem.  Skin: Negative for rash.  Neurological: Negative for light-headedness and headaches.  Psychiatric/Behavioral: Positive for decreased concentration and dysphoric mood. Negative for agitation, behavioral problems, self-injury, sleep disturbance and suicidal ideas. The patient is nervous/anxious.   All other systems reviewed and are negative.   Per HPI unless specifically indicated above   Allergies as of 07/18/2017      Reactions   Nexium [esomeprazole]    Dizziness       Medication List        Accurate as of 07/18/17 11:49 AM. Always use your most recent med list.          amphetamine-dextroamphetamine 25 MG 24 hr capsule  Commonly known as:  ADDERALL XR Take 2 capsules by mouth every morning.   amphetamine-dextroamphetamine 25 MG 24 hr capsule Commonly known as:  ADDERALL XR Take 2 capsules by mouth every morning. Do not refill until 30 days from prescription date   amphetamine-dextroamphetamine 25 MG 24 hr capsule Commonly known as:  ADDERALL XR Take 1 capsule by mouth every morning. Do not refill until 60 days from prescription date   dicyclomine 10 MG capsule Commonly known as:  BENTYL Take 1 capsule (10 mg total) by mouth 3 (three) times daily before meals.   hydrOXYzine 25 MG tablet Commonly known as:  ATARAX/VISTARIL Take 1 tablet (25 mg total) by mouth 3 (three) times daily as needed.   PARAGARD INTRAUTERINE COPPER Iud IUD 1 each by Intrauterine route once.           Objective:    BP 115/76   Pulse 79   Temp 98.2 F (36.8 C) (Oral)   Ht 5\' 4"  (1.626 m)   Wt 141 lb (64 kg)   BMI 24.20 kg/m   Wt Readings from Last 3 Encounters:  07/18/17 141 lb (64 kg)  05/02/17 155 lb (70.3 kg)  10/27/16 137 lb (62.1 kg)    Physical Exam  Constitutional: She is oriented to person, place, and time. She appears well-developed and well-nourished. No distress.  Eyes: Conjunctivae are normal.  Neck: Neck supple. No thyromegaly present.  Cardiovascular: Normal rate, regular rhythm, normal heart sounds and intact distal pulses.  No murmur heard. Pulmonary/Chest: Effort normal and breath sounds normal. No respiratory distress. She has no wheezes.  Musculoskeletal: Normal range of motion. She exhibits no edema or tenderness.  Lymphadenopathy:    She has no cervical adenopathy.  Neurological: She is alert and oriented to person, place, and time. Coordination normal.  Skin: Skin is warm and dry. No rash noted. She is not diaphoretic.  Psychiatric: Judgment normal. Her mood appears anxious. She exhibits a depressed mood. She expresses no suicidal ideation. She expresses no suicidal plans. She is inattentive.  Nursing note and vitals reviewed.       Assessment & Plan:   Problem List Items Addressed This Visit      Other   Attention deficit hyperactivity disorder (ADHD), combined type   Relevant Medications   amphetamine-dextroamphetamine (ADDERALL XR) 25 MG 24 hr capsule   amphetamine-dextroamphetamine (ADDERALL XR) 25 MG 24 hr capsule   amphetamine-dextroamphetamine (ADDERALL XR) 25 MG 24 hr capsule   Generalized anxiety disorder - Primary   Relevant Medications   hydrOXYzine (ATARAX/VISTARIL) 25 MG tablet       Follow up plan: Return in about 3 months (around 10/17/2017), or if symptoms worsen or fail to improve, for ADHD and anxiety recheck.  Counseling provided for all of the vaccine components No orders of the defined types were placed in this  encounter.   Arville Care, MD [facility] 07/18/2017, 11:49 AM

## 2017-07-19 ENCOUNTER — Encounter: Payer: Self-pay | Admitting: Family Medicine

## 2017-08-04 ENCOUNTER — Encounter: Payer: Self-pay | Admitting: Family Medicine

## 2017-08-07 ENCOUNTER — Ambulatory Visit: Payer: Self-pay | Admitting: Family Medicine

## 2017-10-09 ENCOUNTER — Telehealth: Payer: Self-pay | Admitting: Family Medicine

## 2017-10-09 DIAGNOSIS — F902 Attention-deficit hyperactivity disorder, combined type: Secondary | ICD-10-CM

## 2017-10-10 MED ORDER — AMPHETAMINE-DEXTROAMPHET ER 25 MG PO CP24: 50.0000 mg | ORAL_CAPSULE | ORAL | 0 refills | Status: DC

## 2017-10-10 NOTE — Telephone Encounter (Signed)
Sorry yes I see that, as a typo, put a new dose.

## 2017-10-10 NOTE — Telephone Encounter (Signed)
The third Adderall script has incorrect dose.  First two scripts say take two times but the third script has take one time but still has a quantity of 60.  Please send in one script with corrected dosing instructions. Patient aware to schedule follow up after finishing this script.

## 2017-10-10 NOTE — Telephone Encounter (Signed)
Aware, per message on voice mail, script sent in.

## 2017-11-21 ENCOUNTER — Other Ambulatory Visit: Payer: Self-pay | Admitting: Family Medicine

## 2017-11-21 ENCOUNTER — Encounter: Payer: Self-pay | Admitting: Family Medicine

## 2017-11-21 DIAGNOSIS — F902 Attention-deficit hyperactivity disorder, combined type: Secondary | ICD-10-CM

## 2017-11-23 MED ORDER — AMPHETAMINE-DEXTROAMPHET ER 25 MG PO CP24: 50.0000 mg | ORAL_CAPSULE | ORAL | 0 refills | Status: DC

## 2019-03-23 IMAGING — US US ABDOMEN COMPLETE
1 series · 14 of 25 positions shown · non-contrast
Comparison: 10/03/2012.

CLINICAL DATA: Abdominal pain .

EXAM:
ABDOMEN ULTRASOUND COMPLETE

[Series 1: us abdomen complete · 0.15mm/px · 14 of 119 slices shown]
[im 1/119]
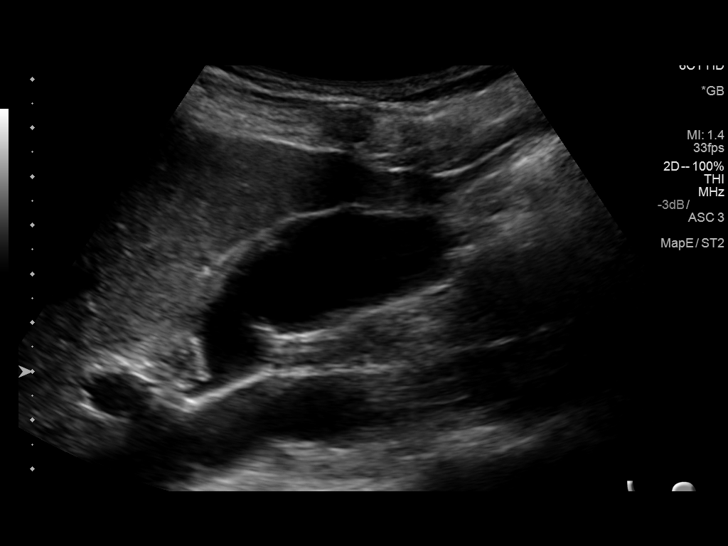
[im 10/119]
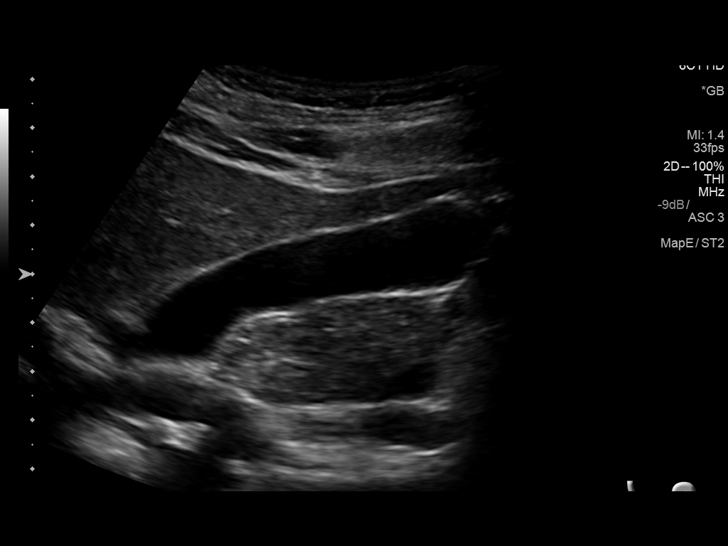
[im 20/119]
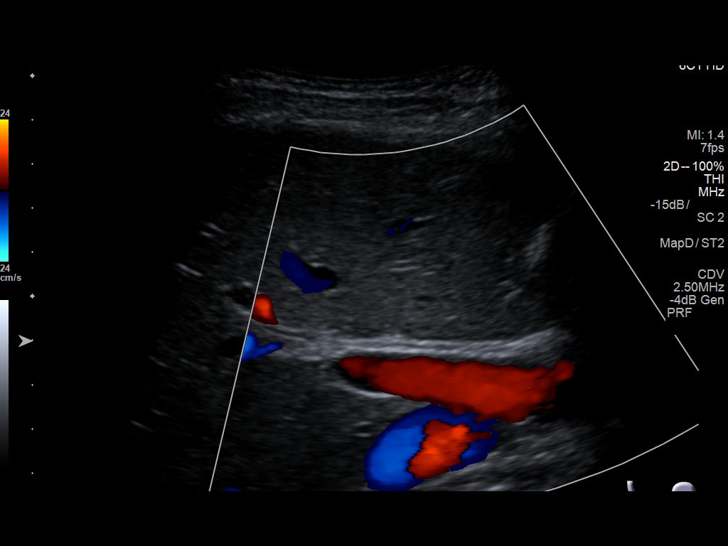
[im 30/119]
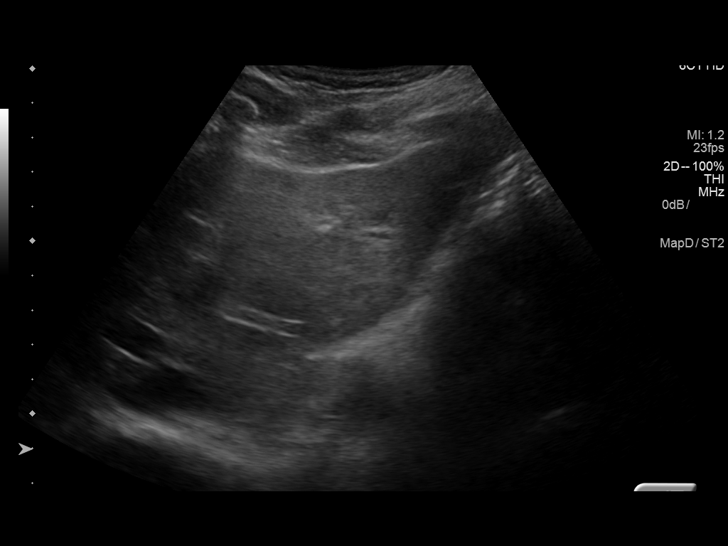
[im 40/119]
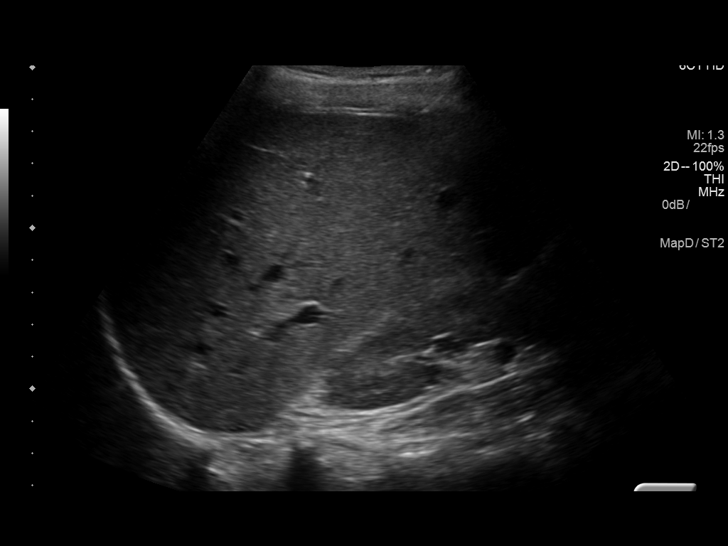
[im 45/119]
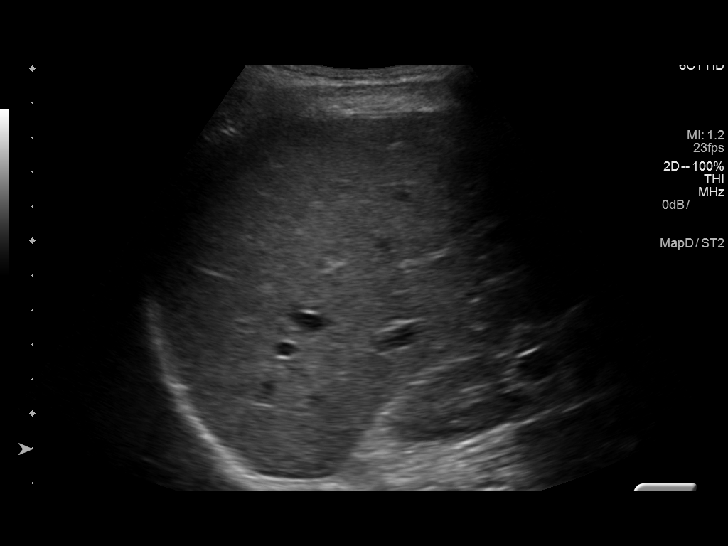
[im 55/119]
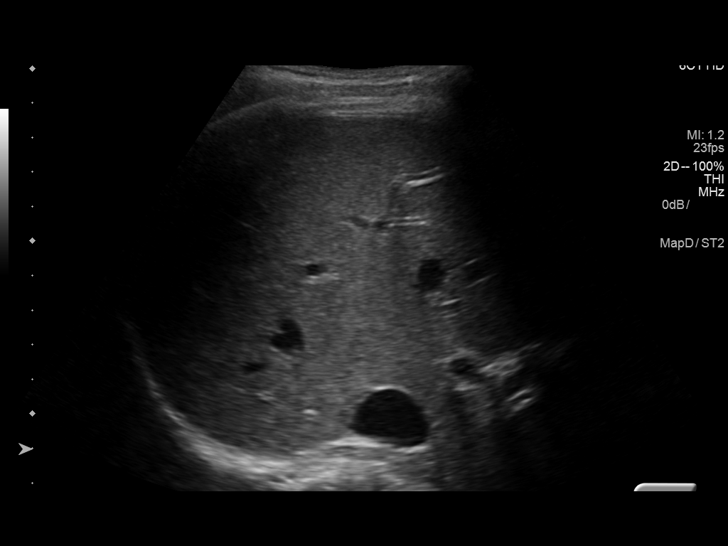
[im 64/119]
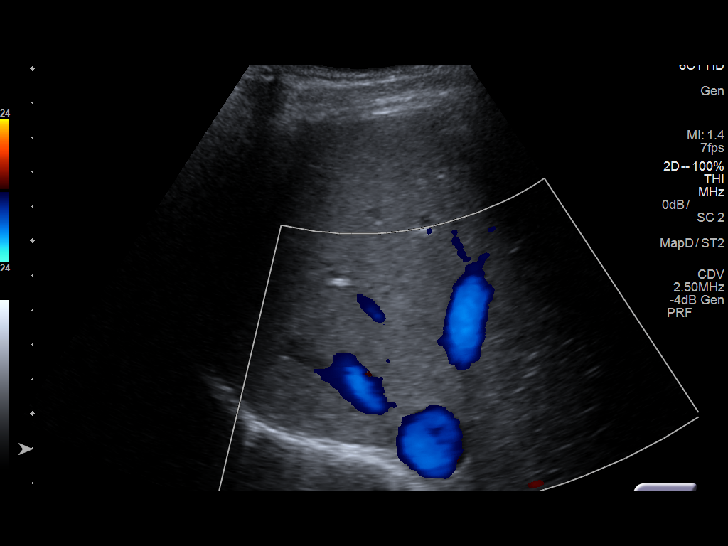
[im 74/119]
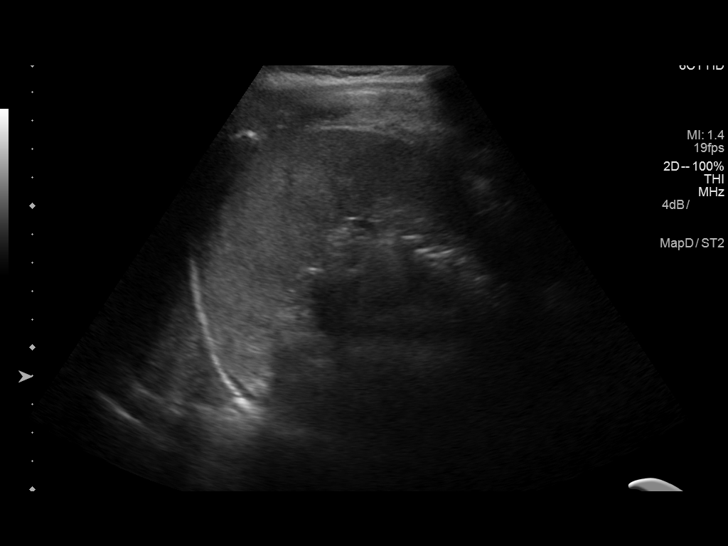
[im 79/119]
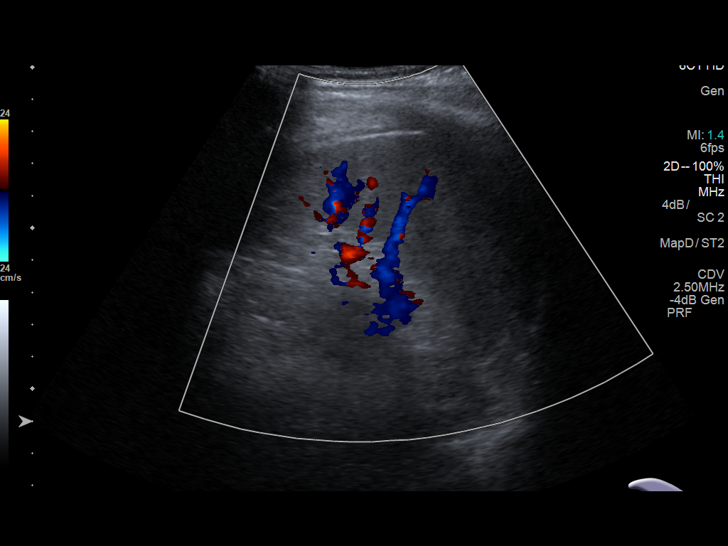
[im 89/119]
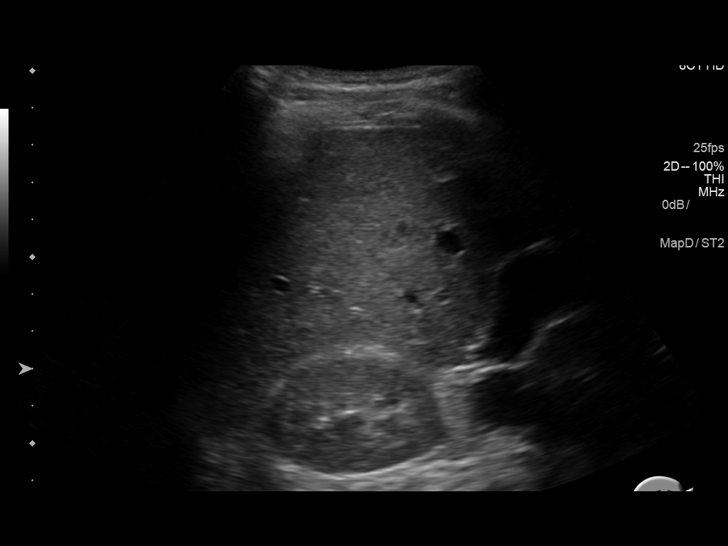
[im 99/119]
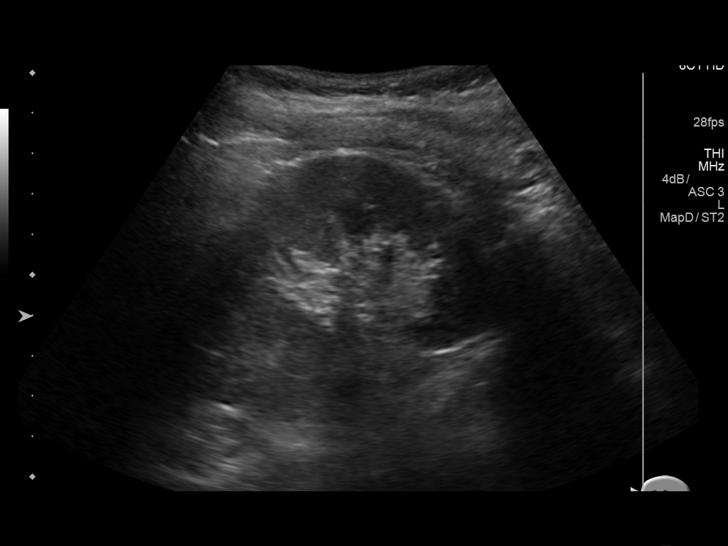
[im 109/119]
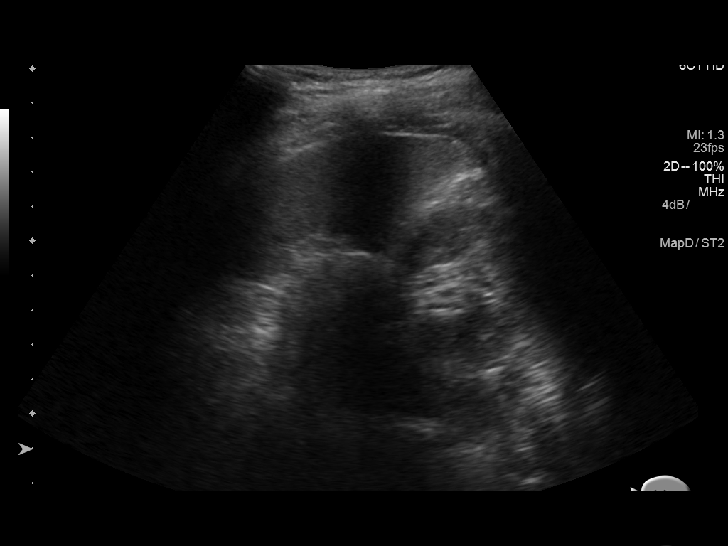
[im 119/119]
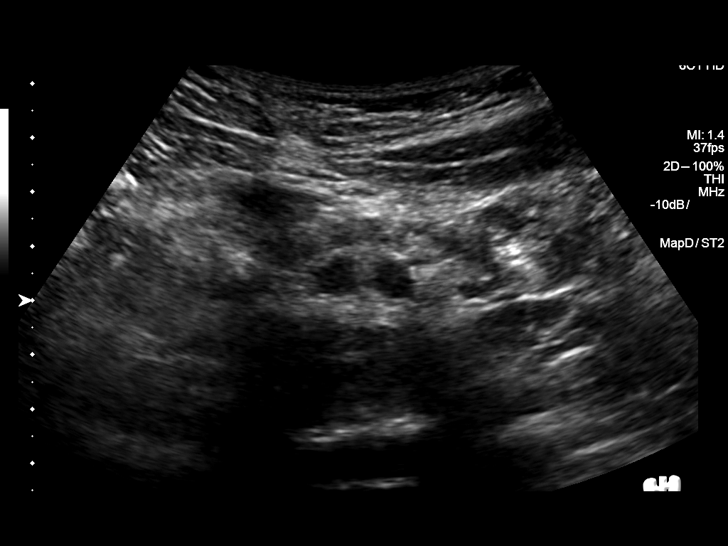

[14 of 25 positions shown; findings below may reference images not displayed]

FINDINGS: Gallbladder: 6 mm polyp noted along the anterior wall gallbladder.
No gallstones noted. Gallbladder wall thickness normal. No
pericholecystic fluid collection. Negative Murphy sign .

Common bile duct: Diameter: 1.8 mm

Liver: No focal lesion identified. Within normal limits in
parenchymal echogenicity.

IVC: No abnormality visualized.

Pancreas: Visualized portion unremarkable.

Spleen: Size and appearance within normal limits.

Right Kidney: Length: 10.3 cm. Echogenicity within normal limits. No
mass or hydronephrosis visualized.

Left Kidney: Length: 9.8 cm. Echogenicity within normal limits. No
mass or hydronephrosis visualized.

Abdominal aorta: No aneurysm visualized.

Other findings: None.
IMPRESSION: 6 mm gallbladder polyp noted.  No gallstones.

## 2019-06-26 ENCOUNTER — Other Ambulatory Visit: Payer: Self-pay | Admitting: Orthopedic Surgery

## 2019-06-26 DIAGNOSIS — M4722 Other spondylosis with radiculopathy, cervical region: Secondary | ICD-10-CM

## 2019-06-28 ENCOUNTER — Ambulatory Visit
Admission: RE | Admit: 2019-06-28 | Discharge: 2019-06-28 | Disposition: A | Discharge status: Home / Self Care | Payer: BC Managed Care – PPO | Source: Ambulatory Visit | Attending: Orthopedic Surgery | Admitting: Orthopedic Surgery

## 2019-06-28 ENCOUNTER — Other Ambulatory Visit: Payer: Self-pay

## 2019-06-28 DIAGNOSIS — M4722 Other spondylosis with radiculopathy, cervical region: Secondary | ICD-10-CM

## 2019-08-06 ENCOUNTER — Other Ambulatory Visit: Payer: Self-pay | Admitting: Neurology

## 2019-08-06 DIAGNOSIS — R202 Paresthesia of skin: Secondary | ICD-10-CM

## 2019-08-07 ENCOUNTER — Ambulatory Visit
Admission: RE | Admit: 2019-08-07 | Discharge: 2019-08-07 | Disposition: A | Discharge status: Home / Self Care | Payer: BC Managed Care – PPO | Source: Ambulatory Visit | Attending: Neurology | Admitting: Neurology

## 2019-08-07 DIAGNOSIS — R202 Paresthesia of skin: Secondary | ICD-10-CM

## 2019-08-07 MED ORDER — GADOBENATE DIMEGLUMINE 529 MG/ML IV SOLN
15.0000 mL | Freq: Once | INTRAVENOUS | Status: AC | PRN
  Administered 2019-08-07: 15 mL via INTRAVENOUS

## 2019-09-01 ENCOUNTER — Other Ambulatory Visit: Payer: BC Managed Care – PPO

## 2020-03-24 ENCOUNTER — Encounter: Payer: Self-pay | Admitting: Neurology

## 2020-03-24 ENCOUNTER — Ambulatory Visit: Payer: 59 | Admitting: Neurology

## 2020-03-24 VITALS — BP 105/65 | HR 81 | Ht 64.0 in | Wt 165.2 lb

## 2020-03-24 DIAGNOSIS — R5383 Other fatigue: Secondary | ICD-10-CM | POA: Diagnosis not present

## 2020-03-24 DIAGNOSIS — R200 Anesthesia of skin: Secondary | ICD-10-CM

## 2020-03-24 DIAGNOSIS — M542 Cervicalgia: Secondary | ICD-10-CM

## 2020-03-24 NOTE — Progress Notes (Signed)
[facility]  PATIENT: Pamela Potts DOB: 12-28-1990  REFERRING DOCTOR OR PCP: Dr. Enedina Finner SOURCE: Patient, notes from Dr. Imogene Burn, MRI and laboratory reports, MRI images personally reviewed.  _________________________________   HISTORICAL  CHIEF COMPLAINT:  Chief Complaint  Patient presents with  . New Patient (Initial Visit)    RM 13, alone. Paper referral from Mickel Baas for UE numb.     HISTORY OF PRESENT ILLNESS:  I had the pleasure of seeing your patient, Pamela Potts, at [facility] for neurologic consultation regarding her arm numbness and pain.  She is a 29 year old woman who has pain in between her shoulder lades into her arms.   Arms go numb easily, left > right.   This can occur during the day.  She has trouble finding a comfortable position at night and will wake up with more intense numbness all the way to the arms into the hand and all fingers.  Holding her arms up (I.e to do hair) makes her arms feel heavy and she needs to take a break.       Her legs are fine, only occasionally having numbness if on toilet or hard seat.   No bladder issues.      As a high school student, she had a right sided 'stinger' during a meet with numbness that improved.  Symptoms were better by the time she saw neurology and no further workup was performed.    She has had an MRI of the cervical spine 3/20/21showing mild disc bulging at C3-C4 and C6-C7 but no foraminal or spinal stenosis.  No nerve root compression.   MRI of the brain 08/07/2019 showed a few tiny T2 hyperintense in the periatrial white matter and a very small left frontal DVM.    She also reports fatigue and was once diagnosed with CFS.  This is much better this year.   She had low vitamin D and B12 and took supplements.   A change in jobs also reduced stress.     REVIEW OF SYSTEMS: Constitutional: No fevers, chills, sweats, or change in appetite.  She sometimes  notes fatigue. Eyes: No visual changes, double vision, eye pain Ear, nose and throat: No hearing loss, ear pain, nasal congestion, sore throat Cardiovascular: No chest pain, palpitations Respiratory: No shortness of breath at rest or with exertion.   No wheezes GastrointestinaI: No nausea, vomiting, diarrhea, abdominal pain, fecal incontinence.  She has had some GERD Genitourinary: No dysuria, urinary retention or frequency.  No nocturia. Musculoskeletal: No neck pain, back pain Integumentary: No rash, pruritus, skin lesions Neurological: as above Psychiatric: No depression at this time.  No anxiety.  She has ADD. Endocrine: No palpitations, diaphoresis, change in appetite, change in weigh or increased thirst Hematologic/Lymphatic: No anemia, purpura, petechiae. Allergic/Immunologic: No itchy/runny eyes, nasal congestion, recent allergic reactions, rashes  ALLERGIES: Allergies  Allergen Reactions  . Nexium [Esomeprazole]     Dizziness     HOME MEDICATIONS:  Current Outpatient Medications:  .  lisdexamfetamine (VYVANSE) 60 MG capsule, Take 60 mg by mouth every morning., Disp: , Rfl:  .  PARAGARD INTRAUTERINE COPPER IUD IUD, 1 each by Intrauterine route once., Disp: , Rfl:   PAST MEDICAL HISTORY: Past Medical History:  Diagnosis Date  . ADHD   . GERD (gastroesophageal reflux disease)   . IBS (irritable bowel syndrome)     PAST SURGICAL HISTORY: Past Surgical History:  Procedure Laterality Date  . ESOPHAGOGASTRODUODENOSCOPY N/A 09/14/2016   Procedure:  ESOPHAGOGASTRODUODENOSCOPY (EGD);  Surgeon: Malissa Hippo, MD;  Location: [facility];  Service: Endoscopy;  Laterality: N/A;  1:00  . right arm  2007   aztec nm    FAMILY HISTORY: Family History  Problem Relation Age of Onset  . Hypertension Other   . Cancer Other   . Anxiety disorder Father   . Depression Father     SOCIAL HISTORY:  Social History   Socioeconomic History  . Marital status: Single     Spouse name: Not on file  . Number of children: 0  . Years of education: 63  . Highest education level: Not on file  Occupational History  . Occupation: Works from [employer]  . Smoking status: Never Smoker  . Smokeless tobacco: Never Used  Vaping Use  . Vaping Use: Never used  Substance and Sexual Activity  . Alcohol use: Yes    Alcohol/week: 1.0 standard drink    Types: 1 Glasses of wine per week    Comment: 3 per year  . Drug use: No  . Sexual activity: Yes    Birth control/protection: I.U.D.  Other Topics Concern  . Not on file  Social History Narrative   Right handed   Caffeine use: soda    Lives with partner   Social Determinants of Health   Financial Resource Strain: Not on file  Food Insecurity: Not on file  Transportation Needs: Not on file  Physical Activity: Not on file  Stress: Not on file  Social Connections: Not on file  Intimate Partner Violence: Not on file     PHYSICAL EXAM  Vitals:   03/24/20 1010  BP: 105/65  Pulse: 81  SpO2: 98%  Weight: 165 lb 3.2 oz (74.9 kg)  Height: 5\' 4"  (1.626 m)    Body mass index is 28.36 kg/m.   General: The patient is well-developed and well-nourished and in no acute distress  HEENT:  Head is [place]/AT.  Sclera are anicteric.  Funduscopic exam shows normal optic discs and retinal vessels.  Neck: No carotid bruits are noted.  The neck is nontender.  Cardiovascular: The heart has a regular rate and rhythm with a normal S1 and S2. There were no murmurs, gallops or rubs.    Skin: Extremities are without rash or  edema.  Musculoskeletal:  Back is nontender  Neurologic Exam  Mental status: The patient is alert and oriented x 3 at the time of the examination. The patient has apparent normal recent and remote memory, with an apparently normal attention span and concentration ability.   Speech is normal.  Cranial nerves: Extraocular movements are full. Pupils are equal, round, and reactive to  light and accomodation.  Visual fields are full.  Facial symmetry is present. There is good facial sensation to soft touch bilaterally.Facial strength is normal.  Trapezius and sternocleidomastoid strength is normal. No dysarthria is noted.  The tongue is midline, and the patient has symmetric elevation of the soft palate. No obvious hearing deficits are noted.  Motor:  Muscle bulk is normal.   Tone is normal. Strength is  5 / 5 in all 4 extremities.   Sensory: Reduce pinprick sensation over the left hypothenar eminence and right thenar eminence.  Mild Tinels's sing at right elbow.   Some asymmetry in finger pinprick sensation  Coordination: Cerebellar testing reveals good finger-nose-finger and heel-to-shin bilaterally.  Gait and station: Station is normal.   Gait is normal. Tandem gait is normal. Romberg is negative.  Reflexes: Deep tendon reflexes are symmetric and normal bilaterally.   Plantar responses are flexor.    DIAGNOSTIC DATA (LABS, IMAGING, TESTING) - I reviewed patient records, labs, notes, testing and imaging myself where available.  Lab Results  Component Value Date   WBC 8.0 10/27/2016   HGB 13.5 10/27/2016   HCT 37.9 10/27/2016   MCV 86 10/27/2016   PLT 255 10/27/2016      Component Value Date/Time   NA 137 08/07/2016 1254   K 3.5 08/07/2016 1254   CL 104 08/07/2016 1254   CO2 27 08/07/2016 1254   GLUCOSE 85 08/07/2016 1254   BUN 10 08/07/2016 1254   CREATININE 0.76 08/07/2016 1254   CALCIUM 9.2 08/07/2016 1254   PROT 7.6 08/07/2016 1254   ALBUMIN 4.4 08/07/2016 1254   AST 18 08/07/2016 1254   ALT 14 08/07/2016 1254   ALKPHOS 26 (L) 08/07/2016 1254   BILITOT 0.3 08/07/2016 1254   GFRNONAA >60 08/07/2016 1254   GFRAA >60 08/07/2016 1254       ASSESSMENT AND PLAN  Arm numbness - Plan: NCV with EMG(electromyography)  Other fatigue  Neck pain   In summary, Pamela Potts is a 29 year old woman with bilateral arm numbness and pain.  The etiology of  the numbness is not completely apparent.  She did have mild Tinel signs at the elbows and it is possible that she has ulnar neuropathy contributing to her symptoms.  Additionally, she could have a lower cervical radiculopathy.  She is concerned about thoracic outlet syndrome.  Although this could be consistent with some of her symptoms, I did not see cervical ribs on the cervical spine x-ray that she had April 2021.  We will check a NCV/EMG study.  This will help [place] to better determine if there is any evidence of mononeuropathy, radiculopathy or plexopathy.  I will see her when she returns for the NCV/EMG study.  She should call sooner if new or worsening neurologic symptoms.  Thank you for asking me to see Pamela Potts.  Please let me know if I can be of further assistance with her or other patients in the future.  Pamela Potts A. Epimenio Foot, MD, [facility] 03/24/2020, 5:24 PM Certified in Neurology, Clinical Neurophysiology, Sleep Medicine and Neuroimaging  [facility] [address], [address] [place], [place] [address] [phone]

## 2020-04-13 ENCOUNTER — Encounter: Payer: Self-pay | Admitting: Neurology

## 2020-04-13 ENCOUNTER — Other Ambulatory Visit: Payer: Self-pay

## 2020-04-13 ENCOUNTER — Ambulatory Visit (INDEPENDENT_AMBULATORY_CARE_PROVIDER_SITE_OTHER): Payer: 59 | Admitting: Neurology

## 2020-04-13 ENCOUNTER — Encounter (INDEPENDENT_AMBULATORY_CARE_PROVIDER_SITE_OTHER): Payer: 59 | Admitting: Neurology

## 2020-04-13 DIAGNOSIS — M542 Cervicalgia: Secondary | ICD-10-CM | POA: Diagnosis not present

## 2020-04-13 DIAGNOSIS — R200 Anesthesia of skin: Secondary | ICD-10-CM

## 2020-04-13 DIAGNOSIS — Z0289 Encounter for other administrative examinations: Secondary | ICD-10-CM

## 2020-04-13 MED ORDER — CYCLOBENZAPRINE HCL 5 MG PO TABS: 5.0000 mg | ORAL_TABLET | Freq: Every day | ORAL | 5 refills | Status: DC

## 2020-04-13 NOTE — Progress Notes (Signed)
Full Name: Pamela Potts Gender: Female MRN #: [id number] Date of Birth: 31-Jul-1990    Visit Date: 04/13/2020 07:21 Age: 30 Years Examining Physician: Despina Arias, MD  Referring Physician: Despina Arias, MD    History: Pamela Potts is a 30 year old woman with numbness and pain in the upper back and both arms, left greater than right.  On exam, strength was normal.  She reported reduced sensation to touch in the palmar aspect of the first 3 fingers.  MRI of the cervical spine had shown mild degenerative changes at C3-C4 and C6-C7 but no definite nerve root compression.  Nerve conduction studies:  Bilateral median and ulnar motor responses had normal distal latencies, amplitudes and conduction velocities.  Bilateral median and ulnar sensory responses had normal peak latencies and amplitudes.  The ulnar F-wave responses were normal.  Electromyography: Needle EMG of selected muscles of the left arm was performed.  There was mild chronic denervation of the left triceps and a few polyphasic motor units in the left EDC muscle.  Other muscles tested had normal motor unit morphology and recruitment.  There was no abnormal spontaneous activity.  Impression: This NCV/EMG study shows the following: 1.   Although there was no significant radiculopathy noted.  A minimal left chronic C7 radiculopathy cannot be ruled out as mild chronic denervation was seen in the triceps. 2.   No evidence of polyneuropathy, mononeuropathy or myopathy.  Pamela Potts A. Epimenio Foot, MD, PhD, FAAN Certified in Neurology, Clinical Neurophysiology, Sleep Medicine, Pain Medicine and Neuroimaging Director, [facility]  [facility] [address] [place], [place] [address] [phone]  Clinical note: To help with the pain and sensory symptoms that may be causing insomnia at night I will add Flexeril 5 mg. -- RAS  Verbal informed consent was  obtained from the patient, patient was informed of potential risk of procedure, including bruising, bleeding, hematoma formation, infection, muscle weakness, muscle pain, numbness, among others.         MNC    Nerve / Sites Muscle Latency Ref. Amplitude Ref. Rel Amp Segments Distance Velocity Ref. Area    ms ms mV mV %  cm m/s m/s mVms  R Median - APB     Wrist APB 4.2 ?4.4 8.2 ?4.0 100 Wrist - APB 7   41.0     Upper arm APB 8.0  8.1  99.1 Upper arm - Wrist 20 53 ?49 41.3  L Median - APB     Wrist APB 3.9 ?4.4 9.8 ?4.0 100 Wrist - APB 7   45.3     Upper arm APB 7.5  10.0  102 Upper arm - Wrist 20 55 ?49 46.6  R Ulnar - ADM     Wrist ADM 2.7 ?3.3 9.1 ?6.0 100 Wrist - ADM 7   37.0     B.Elbow ADM 5.5  8.9  97.9 B.Elbow - Wrist 17 61 ?49 34.3     A.Elbow ADM 7.1  9.1  103 A.Elbow - B.Elbow 10 60 ?49 35.8         A.Elbow - Wrist      L Ulnar - ADM     Wrist ADM 3.1 ?3.3 8.8 ?6.0 100 Wrist - ADM 7   39.4     B.Elbow ADM 6.5  8.9  102 B.Elbow - Wrist 18 53 ?49 38.9     A.Elbow ADM 8.4  8.1  90.9 A.Elbow - B.Elbow 10  52 ?49 35.2         A.Elbow - Wrist                 SNC    Nerve / Sites Rec. Site Peak Lat Ref.  Amp Ref. Segments Distance Peak Diff Ref.    ms ms V V  cm ms ms  R Median, Ulnar - Transcarpal comparison     Median Palm Wrist 2.2 ?2.2 107 ?35 Median Palm - Wrist 8       Ulnar Palm Wrist 2.2 ?2.2 23 ?12 Ulnar Palm - Wrist 8          Median Palm - Ulnar Palm  -0.0 ?0.4  L Median, Ulnar - Transcarpal comparison     Median Palm Wrist 2.3 ?2.2 76 ?35 Median Palm - Wrist 8       Ulnar Palm Wrist 2.2 ?2.2 42 ?12 Ulnar Palm - Wrist 8          Median Palm - Ulnar Palm  0.1 ?0.4  R Median - Orthodromic (Dig II, Mid palm)     Dig II Wrist 3.4 ?3.4 16 ?10 Dig II - Wrist 13    L Median - Orthodromic (Dig II, Mid palm)     Dig II Wrist 3.4 ?3.4 14 ?10 Dig II - Wrist 13    R Ulnar - Orthodromic, (Dig V, Mid palm)     Dig V Wrist 3.1 ?3.1 9 ?5 Dig V - Wrist 11    L Ulnar -  Orthodromic, (Dig V, Mid palm)     Dig V Wrist 3.0 ?3.1 10 ?5 Dig V - Wrist 37                   F  Wave    Nerve F Lat Ref.   ms ms  R Ulnar - ADM 25.4 ?32.0  L Ulnar - ADM 26.6 ?32.0         EMG Summary Table    Spontaneous MUAP Recruitment  Muscle IA Fib PSW Fasc Other Amp Dur. Poly Pattern  L. Deltoid Normal None None None _______ Normal Normal Normal Normal  L. Triceps brachii Normal None None None _______ Normal Normal 1+ Reduced  L. Biceps brachii Normal None None None _______ Normal Normal Normal Normal  L. Extensor digitorum communis Normal None None None _______ Normal Normal 1+ Normal  L. Flexor carpi ulnaris Normal None None None _______ Normal Normal Normal Normal  L. First dorsal interosseous Normal None None None _______ Normal Normal Normal Normal  L. Abductor pollicis brevis Normal None None None _______ Normal Normal Normal Normal

## 2020-04-13 NOTE — Addendum Note (Signed)
Addended by: Despina Arias A on: 04/13/2020 10:06 AM   Modules accepted: Orders

## 2020-11-14 IMAGING — MR MR HEAD WO/W CM
11 series · 48 of 48 positions shown · IV contrast (multihance)
Comparison: Noncontrast head CT 02/24/2010

CLINICAL DATA: Paresthesia. Additional history provided: Patient
reports symptoms including bilateral arm and hand numbness, balance
issues, headaches, dizziness, vomiting, tiring quickly, symptoms for
1 year.

EXAM:
MRI HEAD WITHOUT AND WITH CONTRAST
TECHNIQUE: Multiplanar, multiecho pulse sequences of the brain and surrounding
structures were obtained without and with intravenous contrast.
CONTRAST:  15mL MULTIHANCE GADOBENATE DIMEGLUMINE 529 MG/ML IV SOLN

[Series 5: T1 · sagittal · 4.0mm · 0.75mm/px · 2 of 31 slices shown (1 of 3)]
[im 1/31]
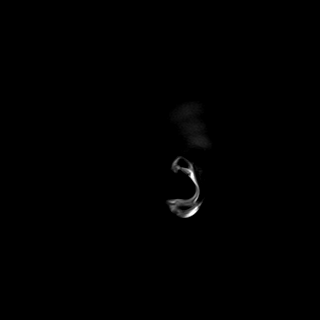
[im 31/31]
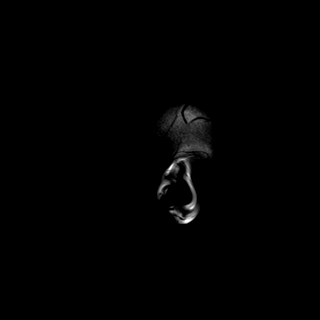

[Series 6: DWI · axial · 3.0mm · 1.44mm/px · z∈[-84,+47]mm · 6 of 84 slices shown (1 of 2)]
[im 1/84]
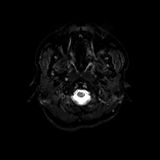
[im 17/84]
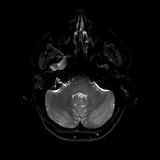
[im 34/84]
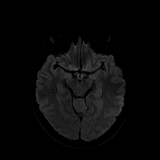
[im 50/84]
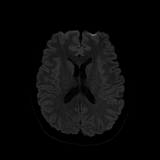
[im 67/84]
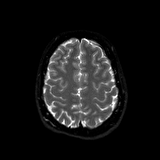
[im 84/84]
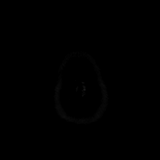

[Series 7: DWI · axial · 3.0mm · 1.44mm/px · z∈[-84,+47]mm · 3 of 42 slices shown (2 of 2)]
[im 1/42]
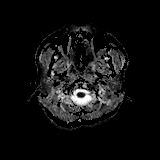
[im 21/42]
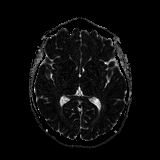
[im 42/42]
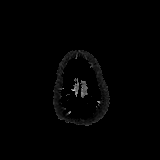

[Series 8: T2 · axial · 4.0mm · 0.36mm/px · z∈[-84,+47]mm · 2 of 27 slices shown]
[im 1/27]
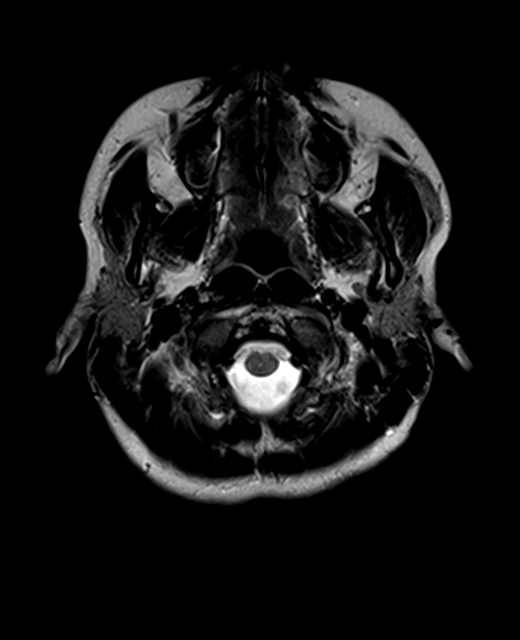
[im 27/27]
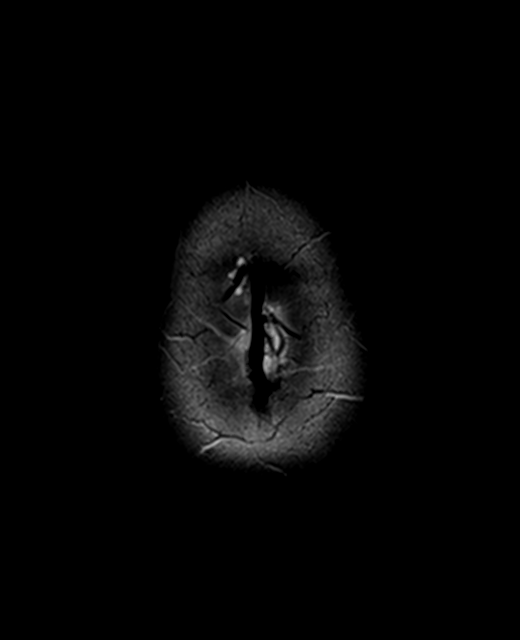

[Series 9: FLAIR · axial · 3.0mm · 0.72mm/px · z∈[-83,+48]mm · 2 of 26 slices shown (1 of 2)]
[im 1/26]
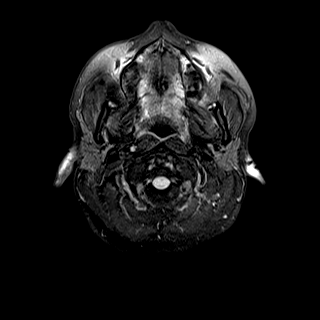
[im 26/26]
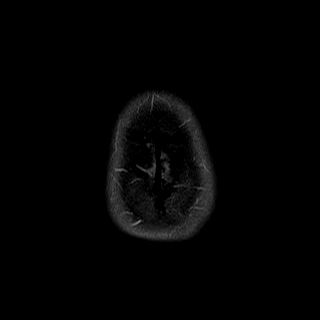

[Series 11: swi_images · axial · 2.0mm · 0.90mm/px · z∈[-87,+50]mm · 5 of 72 slices shown]
[im 1/72]
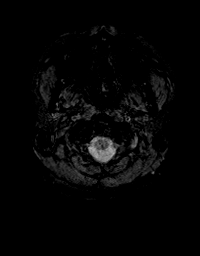
[im 18/72]
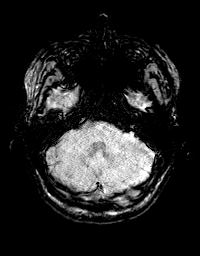
[im 36/72]
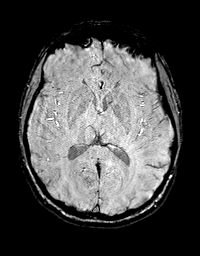
[im 54/72]
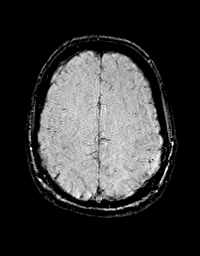
[im 72/72]
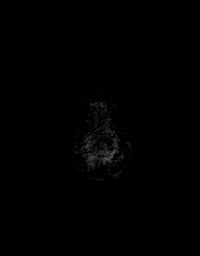

[Series 12: T1 · axial · 1.0mm · 0.94mm/px · z∈[-97,+56]mm · 11 of 159 slices shown (2 of 3)]
[im 1/159]
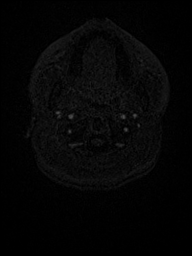
[im 16/159]
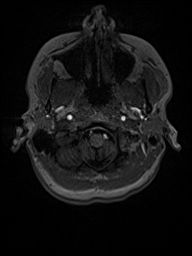
[im 32/159]
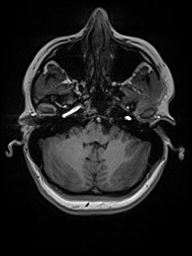
[im 48/159]
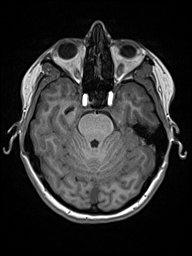
[im 64/159]
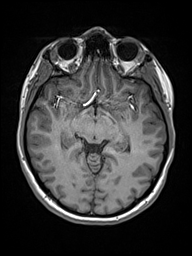
[im 80/159]
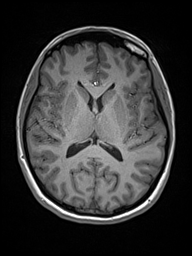
[im 95/159]
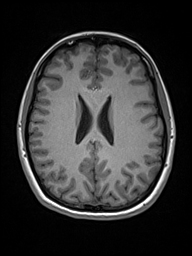
[im 111/159]
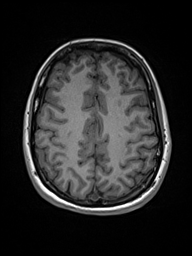
[im 127/159]
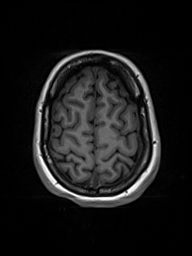
[im 143/159]
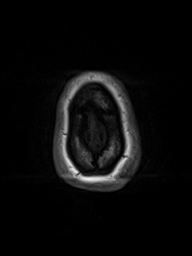
[im 159/159]
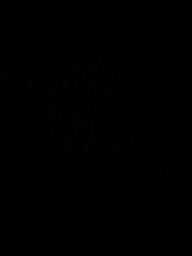

[Series 14: FLAIR · sagittal · 4.0mm · 0.72mm/px · 2 of 24 slices shown (2 of 2)]
[im 1/24]
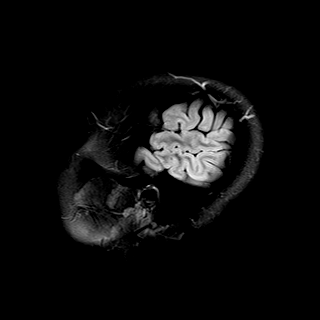
[im 24/24]
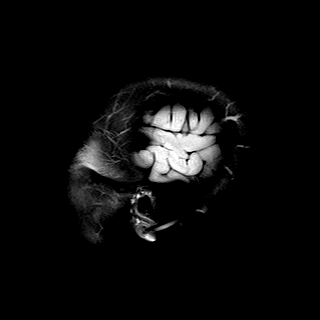

[Series 15: T2 post-contrast · coronal · 4.0mm · 0.36mm/px · 2 of 31 slices shown]
[im 1/31]
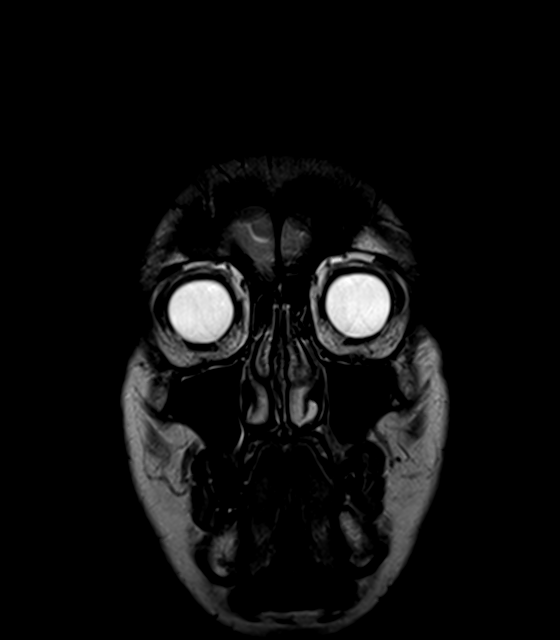
[im 31/31]
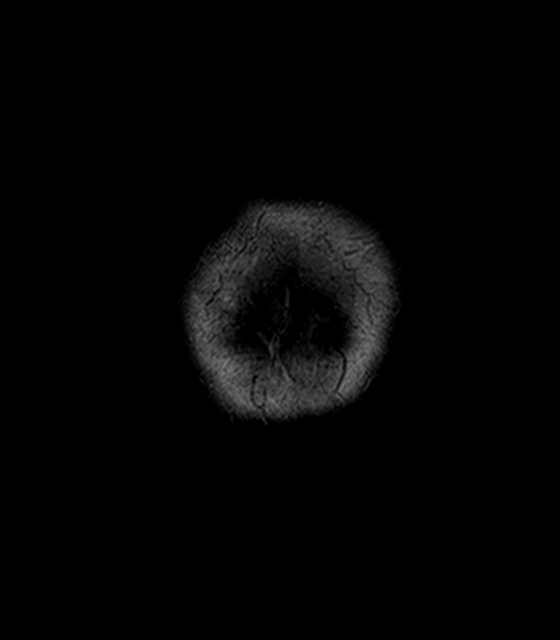

[Series 16: T1 · axial · 1.0mm · 0.94mm/px · z∈[-97,+54]mm · 11 of 157 slices shown (3 of 3)]
[im 1/157]
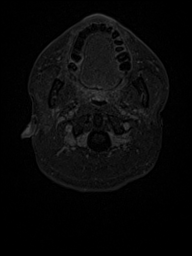
[im 16/157]
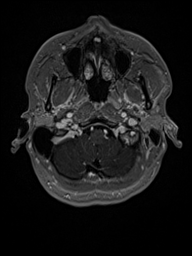
[im 32/157]
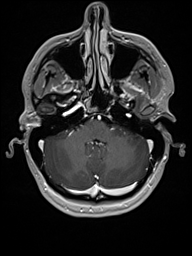
[im 47/157]
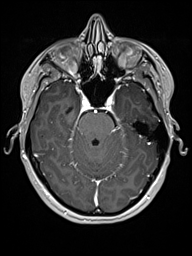
[im 63/157]
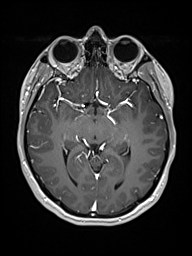
[im 79/157]
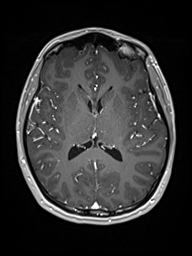
[im 94/157]
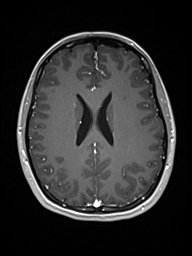
[im 110/157]
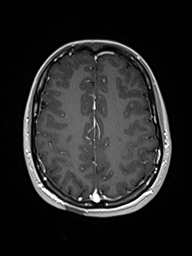
[im 125/157]
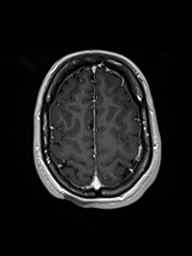
[im 141/157]
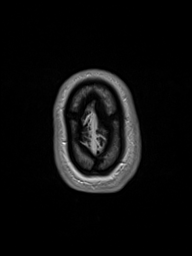
[im 157/157]
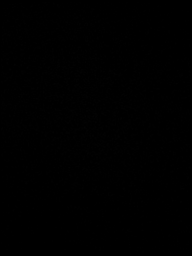

[Series 17: T1 post-contrast · coronal · 4.0mm · 0.72mm/px · 2 of 31 slices shown]
[im 1/31]
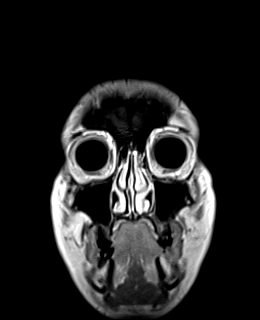
[im 31/31]
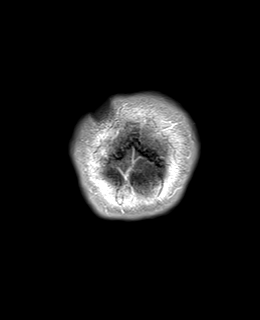

[48 of 48 positions shown; findings below may reference images not displayed]

FINDINGS: Brain:

There are few nonspecific foci of T2/FLAIR hyperintensity within the
bilateral periatrial white matter measuring only a few millimeters.
No other focal parenchymal signal abnormality is identified.
Cerebral volume is normal.

Incidentally noted small developmental venous anomaly within the
left frontoparietal white matter, an anatomic variant. No other
abnormal intracranial enhancement is demonstrated.

There is no acute infarct.

No evidence of intracranial mass.

No chronic intracranial blood products.

No extra-axial fluid collection.

No midline shift.

Vascular: Expected proximal arterial flow voids.

Skull and upper cervical spine: No focal marrow lesion.

Sinuses/Orbits: Visualized orbits show no acute finding. No
significant paranasal sinus disease or mastoid effusion.
IMPRESSION: 1. No evidence of acute intracranial abnormality.
2. There are few tiny nonspecific T2 hyperintense signal changes
within the bilateral periatrial white matter.
3. Incidentally noted left frontoparietal developmental venous
anomaly, an anatomic variant.
4. Otherwise unremarkable MRI appearance of the brain.

## 2022-05-03 DIAGNOSIS — F4323 Adjustment disorder with mixed anxiety and depressed mood: Secondary | ICD-10-CM | POA: Diagnosis not present

## 2022-05-03 DIAGNOSIS — Z6827 Body mass index (BMI) 27.0-27.9, adult: Secondary | ICD-10-CM | POA: Diagnosis not present

## 2022-05-11 DIAGNOSIS — F411 Generalized anxiety disorder: Secondary | ICD-10-CM | POA: Diagnosis not present

## 2022-05-17 DIAGNOSIS — F411 Generalized anxiety disorder: Secondary | ICD-10-CM | POA: Diagnosis not present

## 2022-05-25 DIAGNOSIS — F411 Generalized anxiety disorder: Secondary | ICD-10-CM | POA: Diagnosis not present

## 2022-05-29 DIAGNOSIS — F411 Generalized anxiety disorder: Secondary | ICD-10-CM | POA: Diagnosis not present

## 2022-06-05 DIAGNOSIS — F411 Generalized anxiety disorder: Secondary | ICD-10-CM | POA: Diagnosis not present

## 2022-06-12 DIAGNOSIS — F411 Generalized anxiety disorder: Secondary | ICD-10-CM | POA: Diagnosis not present

## 2022-06-19 DIAGNOSIS — F411 Generalized anxiety disorder: Secondary | ICD-10-CM | POA: Diagnosis not present

## 2022-06-19 DIAGNOSIS — F4312 Post-traumatic stress disorder, chronic: Secondary | ICD-10-CM | POA: Diagnosis not present

## 2022-06-21 DIAGNOSIS — Z021 Encounter for pre-employment examination: Secondary | ICD-10-CM | POA: Diagnosis not present

## 2022-06-26 DIAGNOSIS — F411 Generalized anxiety disorder: Secondary | ICD-10-CM | POA: Diagnosis not present

## 2022-07-10 DIAGNOSIS — F411 Generalized anxiety disorder: Secondary | ICD-10-CM | POA: Diagnosis not present

## 2022-07-10 DIAGNOSIS — F4312 Post-traumatic stress disorder, chronic: Secondary | ICD-10-CM | POA: Diagnosis not present

## 2022-07-12 DIAGNOSIS — F410 Panic disorder [episodic paroxysmal anxiety] without agoraphobia: Secondary | ICD-10-CM | POA: Diagnosis not present

## 2022-07-12 DIAGNOSIS — F322 Major depressive disorder, single episode, severe without psychotic features: Secondary | ICD-10-CM | POA: Diagnosis not present

## 2022-07-12 DIAGNOSIS — F411 Generalized anxiety disorder: Secondary | ICD-10-CM | POA: Diagnosis not present

## 2022-07-17 DIAGNOSIS — F411 Generalized anxiety disorder: Secondary | ICD-10-CM | POA: Diagnosis not present

## 2022-07-17 DIAGNOSIS — F4312 Post-traumatic stress disorder, chronic: Secondary | ICD-10-CM | POA: Diagnosis not present

## 2022-07-24 DIAGNOSIS — F411 Generalized anxiety disorder: Secondary | ICD-10-CM | POA: Diagnosis not present

## 2022-07-24 DIAGNOSIS — F4312 Post-traumatic stress disorder, chronic: Secondary | ICD-10-CM | POA: Diagnosis not present

## 2022-07-31 DIAGNOSIS — F4312 Post-traumatic stress disorder, chronic: Secondary | ICD-10-CM | POA: Diagnosis not present

## 2022-07-31 DIAGNOSIS — F411 Generalized anxiety disorder: Secondary | ICD-10-CM | POA: Diagnosis not present

## 2022-08-07 DIAGNOSIS — F4312 Post-traumatic stress disorder, chronic: Secondary | ICD-10-CM | POA: Diagnosis not present

## 2022-08-07 DIAGNOSIS — F411 Generalized anxiety disorder: Secondary | ICD-10-CM | POA: Diagnosis not present

## 2022-08-21 DIAGNOSIS — F4312 Post-traumatic stress disorder, chronic: Secondary | ICD-10-CM | POA: Diagnosis not present

## 2022-08-21 DIAGNOSIS — F411 Generalized anxiety disorder: Secondary | ICD-10-CM | POA: Diagnosis not present

## 2022-08-28 DIAGNOSIS — F411 Generalized anxiety disorder: Secondary | ICD-10-CM | POA: Diagnosis not present

## 2022-08-28 DIAGNOSIS — F4312 Post-traumatic stress disorder, chronic: Secondary | ICD-10-CM | POA: Diagnosis not present

## 2022-09-15 DIAGNOSIS — F411 Generalized anxiety disorder: Secondary | ICD-10-CM | POA: Diagnosis not present

## 2022-09-15 DIAGNOSIS — F4312 Post-traumatic stress disorder, chronic: Secondary | ICD-10-CM | POA: Diagnosis not present

## 2022-09-29 DIAGNOSIS — F411 Generalized anxiety disorder: Secondary | ICD-10-CM | POA: Diagnosis not present

## 2022-09-29 DIAGNOSIS — F4312 Post-traumatic stress disorder, chronic: Secondary | ICD-10-CM | POA: Diagnosis not present

## 2022-10-27 DIAGNOSIS — F411 Generalized anxiety disorder: Secondary | ICD-10-CM | POA: Diagnosis not present

## 2022-10-27 DIAGNOSIS — F4312 Post-traumatic stress disorder, chronic: Secondary | ICD-10-CM | POA: Diagnosis not present

## 2022-11-10 DIAGNOSIS — F4312 Post-traumatic stress disorder, chronic: Secondary | ICD-10-CM | POA: Diagnosis not present

## 2022-11-10 DIAGNOSIS — F411 Generalized anxiety disorder: Secondary | ICD-10-CM | POA: Diagnosis not present

## 2022-11-23 DIAGNOSIS — F411 Generalized anxiety disorder: Secondary | ICD-10-CM | POA: Diagnosis not present

## 2022-11-23 DIAGNOSIS — F4312 Post-traumatic stress disorder, chronic: Secondary | ICD-10-CM | POA: Diagnosis not present

## 2022-12-04 DIAGNOSIS — F411 Generalized anxiety disorder: Secondary | ICD-10-CM | POA: Diagnosis not present

## 2022-12-04 DIAGNOSIS — F4312 Post-traumatic stress disorder, chronic: Secondary | ICD-10-CM | POA: Diagnosis not present

## 2022-12-18 DIAGNOSIS — F4312 Post-traumatic stress disorder, chronic: Secondary | ICD-10-CM | POA: Diagnosis not present

## 2022-12-18 DIAGNOSIS — F411 Generalized anxiety disorder: Secondary | ICD-10-CM | POA: Diagnosis not present

## 2022-12-26 DIAGNOSIS — Z6829 Body mass index (BMI) 29.0-29.9, adult: Secondary | ICD-10-CM | POA: Diagnosis not present

## 2022-12-26 DIAGNOSIS — R300 Dysuria: Secondary | ICD-10-CM | POA: Diagnosis not present

## 2023-01-01 DIAGNOSIS — M62838 Other muscle spasm: Secondary | ICD-10-CM | POA: Diagnosis not present

## 2023-01-01 DIAGNOSIS — F909 Attention-deficit hyperactivity disorder, unspecified type: Secondary | ICD-10-CM | POA: Diagnosis not present

## 2023-01-01 DIAGNOSIS — F4312 Post-traumatic stress disorder, chronic: Secondary | ICD-10-CM | POA: Diagnosis not present

## 2023-01-01 DIAGNOSIS — F4323 Adjustment disorder with mixed anxiety and depressed mood: Secondary | ICD-10-CM | POA: Diagnosis not present

## 2023-01-01 DIAGNOSIS — F411 Generalized anxiety disorder: Secondary | ICD-10-CM | POA: Diagnosis not present

## 2023-01-01 DIAGNOSIS — Z6829 Body mass index (BMI) 29.0-29.9, adult: Secondary | ICD-10-CM | POA: Diagnosis not present

## 2023-01-15 DIAGNOSIS — F4312 Post-traumatic stress disorder, chronic: Secondary | ICD-10-CM | POA: Diagnosis not present

## 2023-01-15 DIAGNOSIS — F411 Generalized anxiety disorder: Secondary | ICD-10-CM | POA: Diagnosis not present

## 2023-01-29 DIAGNOSIS — F411 Generalized anxiety disorder: Secondary | ICD-10-CM | POA: Diagnosis not present

## 2023-01-29 DIAGNOSIS — F4312 Post-traumatic stress disorder, chronic: Secondary | ICD-10-CM | POA: Diagnosis not present

## 2023-10-20 ENCOUNTER — Ambulatory Visit (INDEPENDENT_AMBULATORY_CARE_PROVIDER_SITE_OTHER)
Admit: 2023-10-20 | Discharge: 2023-10-20 | Disposition: A | Discharge status: Home / Self Care | Attending: Family Medicine | Admitting: Family Medicine

## 2023-10-20 ENCOUNTER — Encounter (HOSPITAL_BASED_OUTPATIENT_CLINIC_OR_DEPARTMENT_OTHER): Payer: Self-pay | Admitting: Emergency Medicine

## 2023-10-20 ENCOUNTER — Other Ambulatory Visit (HOSPITAL_BASED_OUTPATIENT_CLINIC_OR_DEPARTMENT_OTHER): Admitting: Radiology

## 2023-10-20 ENCOUNTER — Ambulatory Visit (HOSPITAL_BASED_OUTPATIENT_CLINIC_OR_DEPARTMENT_OTHER): Admission: EM | Admit: 2023-10-20 | Discharge: 2023-10-20 | Disposition: A | Discharge status: Home / Self Care

## 2023-10-20 DIAGNOSIS — M7532 Calcific tendinitis of left shoulder: Secondary | ICD-10-CM

## 2023-10-20 DIAGNOSIS — M25511 Pain in right shoulder: Secondary | ICD-10-CM

## 2023-10-20 MED ORDER — CYCLOBENZAPRINE HCL 5 MG PO TABS
ORAL_TABLET | ORAL | 0 refills | Status: AC
Start: 2023-10-20 — End: ?

## 2023-10-20 MED ORDER — PREDNISONE 20 MG PO TABS: ORAL_TABLET | ORAL | 0 refills | Status: AC

## 2023-10-20 NOTE — ED Provider Notes (Signed)
 [facility]    CSN: [id number] Arrival date & time: 10/20/23  1257      History   Chief Complaint Chief Complaint  Patient presents with   Shoulder Pain   Neck Pain    HPI Pamela Potts is a 33 y.o. female.   33 year old female who developed left shoulder pain on 10/17/2023.  She woke up that morning with the shoulder pain.  She thought that perhaps she had slept wrong and just because some tension or swelling.  She normally sleeps with only 1 pillow and on her back but sometimes she does sleep on her left or right side.  She denies any injury nor trauma.  The pain has persisted and gotten worse since 10/17/2023.  She is now having some pain in her right lateral neck and she has some pain in the right deltoid.  She has taken every kind of over-the-counter NSAID with out any relief.  She has tried pain patches that she could apply in the area and that has not helped.   Shoulder Pain Associated symptoms: neck pain (Right lateral neck pain)   Associated symptoms: no back pain and no fever   Neck Pain Associated symptoms: no chest pain and no fever     Past Medical History:  Diagnosis Date   ADHD    GERD (gastroesophageal reflux disease)    IBS (irritable bowel syndrome)     Patient Active Problem List   Diagnosis Date Noted   Arm numbness 03/24/2020   Other fatigue 03/24/2020   Neck pain 03/24/2020   Gastroesophageal reflux disease without esophagitis 09/13/2016   Attention deficit hyperactivity disorder (ADHD), combined type 05/26/2016   Generalized anxiety disorder 05/26/2016    Past Surgical History:  Procedure Laterality Date   ESOPHAGOGASTRODUODENOSCOPY N/A 09/14/2016   Procedure: ESOPHAGOGASTRODUODENOSCOPY (EGD);  Surgeon: Golda Claudis PENNER, MD;  Location: [facility];  Service: Endoscopy;  Laterality: N/A;  1:00   right arm  2007   aztec nm    OB History   No obstetric history on file.      Home Medications    Prior to Admission medications    Medication Sig Start Date End Date Taking? Authorizing Provider  desvenlafaxine (PRISTIQ) 50 MG 24 hr tablet Take 50 mg by mouth daily. 09/21/23  Yes [provider]  lisdexamfetamine (VYVANSE ) 60 MG capsule Take 60 mg by mouth every morning.   Yes [provider]  predniSONE  (DELTASONE ) 20 MG tablet Prednisone  20 mg, #2 pills (40 mg), daily for 3 days then prednisone  20 mg, #1 pill, daily for 3 days. 10/20/23  Yes Ival Domino, FNP  cyclobenzaprine  (FLEXERIL ) 5 MG tablet Take 1 pill, after evening meal, for muscle relaxation and muscle spasm.  Do not use and drive. 10/20/23   Ival Domino, FNP  PARAGARD INTRAUTERINE COPPER IUD IUD 1 each by Intrauterine route once.    [provider]    Family History Family History  Problem Relation Age of Onset   Hypertension Other    Cancer Other    Anxiety disorder Father    Depression Father     Social History Social History   Tobacco Use   Smoking status: Never   Smokeless tobacco: Never  Vaping Use   Vaping status: Never Used  Substance Use Topics   Alcohol use: Yes    Alcohol/week: 1.0 standard drink of alcohol    Types: 1 Glasses of wine per week    Comment: 3 per year  Drug use: No     Allergies   Amoxicillin and Nexium [esomeprazole]   Review of Systems Review of Systems  Constitutional:  Negative for chills and fever.  HENT:  Negative for ear pain and sore throat.   Eyes:  Negative for pain and visual disturbance.  Respiratory:  Negative for cough and shortness of breath.   Cardiovascular:  Negative for chest pain and palpitations.  Gastrointestinal:  Negative for abdominal pain, constipation, diarrhea, nausea and vomiting.  Genitourinary:  Negative for dysuria and hematuria.  Musculoskeletal:  Positive for arthralgias (Right shoulder pain), joint swelling (And pain in the right) and neck pain (Right lateral neck pain). Negative for back pain.  Skin:  Negative for color change and rash.   Neurological:  Negative for seizures and syncope.  All other systems reviewed and are negative.    Physical Exam Triage Vital Signs ED Triage Vitals  Encounter Vitals Group     BP 10/20/23 1311 122/76     Girls Systolic BP Percentile --      Girls Diastolic BP Percentile --      Boys Systolic BP Percentile --      Boys Diastolic BP Percentile --      Pulse Rate 10/20/23 1311 81     Resp 10/20/23 1311 18     Temp 10/20/23 1311 98.5 F (36.9 C)     Temp Source 10/20/23 1311 Oral     SpO2 10/20/23 1311 98 %     Weight --      Height --      Head Circumference --      Peak Flow --      Pain Score 10/20/23 1310 9     Pain Loc --      Pain Education --      Exclude from Growth Chart --    No data found.  Updated Vital Signs BP 122/76 (BP Location: Right Arm)   Pulse 81   Temp 98.5 F (36.9 C) (Oral)   Resp 18   SpO2 98%   Visual Acuity Right Eye Distance:   Left Eye Distance:   Bilateral Distance:    Right Eye Near:   Left Eye Near:    Bilateral Near:     Physical Exam Vitals and nursing note reviewed.  Constitutional:      General: She is not in acute distress.    Appearance: She is well-developed. She is not ill-appearing or toxic-appearing.  HENT:     Head: Normocephalic and atraumatic.     Right Ear: Hearing, tympanic membrane, ear canal and external ear normal.     Left Ear: Hearing, tympanic membrane, ear canal and external ear normal.     Nose: No congestion or rhinorrhea.     Right Sinus: No maxillary sinus tenderness or frontal sinus tenderness.     Left Sinus: No maxillary sinus tenderness or frontal sinus tenderness.     Mouth/Throat:     Lips: Pink.     Mouth: Mucous membranes are moist.     Pharynx: Uvula midline. No oropharyngeal exudate or posterior oropharyngeal erythema.     Tonsils: No tonsillar exudate.  Eyes:     Conjunctiva/sclera: Conjunctivae normal.     Pupils: Pupils are equal, round, and reactive to light.  Cardiovascular:      Rate and Rhythm: Normal rate and regular rhythm.     Heart sounds: S1 normal and S2 normal. No murmur heard. Pulmonary:     Effort: Pulmonary effort  is normal. No respiratory distress.     Breath sounds: Normal breath sounds. No decreased breath sounds, wheezing, rhonchi or rales.  Abdominal:     General: Bowel sounds are normal.     Palpations: Abdomen is soft.     Tenderness: There is no abdominal tenderness.  Musculoskeletal:        General: No swelling.     Right shoulder: Deformity (No visible fractures/dislocations, but the right shoulder does appear to be lower than the left shoulder.) and tenderness (Tenderness at the right sternocleidomastoid muscle in the neck, right trapezius muscle, right upper deltoid muscle and some tenderness in the infraspinatus muscle posteriorly.) present. No swelling, effusion, laceration, bony tenderness or crepitus. Decreased range of motion (Decreased extension upward and laterally.). Decreased strength (Right arm is weaker than left due to pain in the shoulder).     Left shoulder: Normal.     Cervical back: Neck supple.  Lymphadenopathy:     Head:     Right side of head: No submental, submandibular, tonsillar, preauricular or posterior auricular adenopathy.     Left side of head: No submental, submandibular, tonsillar, preauricular or posterior auricular adenopathy.     Cervical: No cervical adenopathy.     Right cervical: No superficial cervical adenopathy.    Left cervical: No superficial cervical adenopathy.  Skin:    General: Skin is warm and dry.     Capillary Refill: Capillary refill takes less than 2 seconds.     Findings: No rash.  Neurological:     Mental Status: She is alert and oriented to person, place, and time.  Psychiatric:        Mood and Affect: Mood normal.      UC Treatments / Results  Labs (all labs ordered are listed, but only abnormal results are displayed) Labs Reviewed - No data to display  EKG   Radiology DG  Shoulder Right Result Date: 10/20/2023 CLINICAL DATA:  Right shoulder pain EXAM: RIGHT SHOULDER - 2+ VIEW COMPARISON:  None Available. FINDINGS: There is no evidence of fracture or dislocation. There is no evidence of arthropathy or other focal bone abnormality. Focal calcifications projecting over the humeral head, may be related to hydroxyapatite deposition. IMPRESSION: Focal calcifications projecting over the humeral head. While location and appearance is not typical, these may be secondary to hydroxyapatite deposition, possibly resulting in calcific tendinitis in the appropriate clinical setting. Electronically Signed   By: Michaeline Blanch M.D.   On: 10/20/2023 14:33    Procedures Procedures (including critical care time)  Medications Ordered in UC Medications - No data to display  Initial Impression / Assessment and Plan / UC Course  I have reviewed the triage vital signs and the nursing notes.  Pertinent labs & imaging results that were available during my care of the patient were reviewed by me and considered in my medical decision making (see chart for details).     Plan of Care: Calcific tendinitis of right shoulder: Prednisone  20 mg, #2 pills (40 mg), daily for 3 days then prednisone  20 mg, #1 pill, daily for 3 days.  Cyclobenzaprine  5 mg nightly for muscle spasms.  Do not use cyclobenzaprine  and drive.  Provided shoulder exercises.  Encouraged massage therapy with deep tissue massage.  Once the prednisone  starts working and the shoulder feels better, start doing gentle exercises to stretch and improve the range of motion.  RICE therapy.  See orthopedics if pain persists and does not resolve.  I reviewed the plan of  care with the patient and/or the patient's guardian.  The patient and/or guardian had time to ask questions and acknowledged that the questions were answered.  I provided instruction on symptoms or reasons to return here or to go to an ER, if symptoms/condition did not  improve, worsened or if new symptoms occurred.  Final Clinical Impressions(s) / UC Diagnoses   Final diagnoses:  Acute pain of right shoulder  Calcific tendonitis of left shoulder     Discharge Instructions      Calcific tendinitis of right shoulder: Provided information about calcific tendinitis.  May need to see orthopedics.  Cyclobenzaprine  5 mg, 1 pill nightly for muscle spasms.  Prednisone  20 mg, #2 pills (40 mg), daily for 3 days then prednisone  20 mg, #1 pill, daily for 3 days.  May use acetaminophen , 500 to 650 mg every 6 hours if needed for pain.  Do not use Advil , Motrin , Aleve, Naproxen Sodium, Ibuprofen  while using the prednisone .  See orthopedics if pain persist.  Follow-up here as needed.     ED Prescriptions     Medication Sig Dispense Auth. Provider   cyclobenzaprine  (FLEXERIL ) 5 MG tablet Take 1 pill, after evening meal, for muscle relaxation and muscle spasm.  Do not use and drive. 30 tablet Zane Samson, FNP   predniSONE  (DELTASONE ) 20 MG tablet Prednisone  20 mg, #2 pills (40 mg), daily for 3 days then prednisone  20 mg, #1 pill, daily for 3 days. 9 tablet Shaynna Husby, FNP      PDMP not reviewed this encounter.   Ival Domino, FNP 10/20/23 [phone]

## 2023-10-20 NOTE — ED Triage Notes (Signed)
 Pt c/o right shoulder pain started on Wednesday she thought maybe she slept wrong but not any better its actually got worse now she has neck and pain that goes down her arm. Pt has applied topical patches and taken OTC pain meds

## 2023-10-20 NOTE — Discharge Instructions (Addendum)
 Calcific tendinitis of right shoulder: Provided information about calcific tendinitis.  May need to see orthopedics.  Cyclobenzaprine  5 mg, 1 pill nightly for muscle spasms.  Prednisone  20 mg, #2 pills (40 mg), daily for 3 days then prednisone  20 mg, #1 pill, daily for 3 days.  May use acetaminophen , 500 to 650 mg every 6 hours if needed for pain.  Do not use Advil , Motrin , Aleve, Naproxen Sodium, Ibuprofen  while using the prednisone .  See orthopedics if pain persist.  Follow-up here as needed.
